# Patient Record
Sex: Male | Born: 1965 | Race: White | Hispanic: No | Marital: Married | Smoking: Former smoker
Health system: Southern US, Community
[De-identification: ages and names within clinical notes are randomized; demographics above are authoritative.]

## PROBLEM LIST (undated history)

## (undated) DIAGNOSIS — M8430XA Stress fracture, unspecified site, initial encounter for fracture: Secondary | ICD-10-CM

## (undated) DIAGNOSIS — F32A Depression, unspecified: Secondary | ICD-10-CM

## (undated) DIAGNOSIS — K219 Gastro-esophageal reflux disease without esophagitis: Secondary | ICD-10-CM

## (undated) DIAGNOSIS — L659 Nonscarring hair loss, unspecified: Secondary | ICD-10-CM

## (undated) DIAGNOSIS — F329 Major depressive disorder, single episode, unspecified: Secondary | ICD-10-CM

## (undated) DIAGNOSIS — K635 Polyp of colon: Secondary | ICD-10-CM

## (undated) DIAGNOSIS — F1010 Alcohol abuse, uncomplicated: Secondary | ICD-10-CM

## (undated) DIAGNOSIS — C439 Malignant melanoma of skin, unspecified: Secondary | ICD-10-CM

## (undated) HISTORY — DX: Depression, unspecified: F32.A

## (undated) HISTORY — DX: Major depressive disorder, single episode, unspecified: F32.9

## (undated) HISTORY — DX: Polyp of colon: K63.5

## (undated) HISTORY — PX: TONSILLECTOMY AND ADENOIDECTOMY: SUR1326

## (undated) HISTORY — DX: Stress fracture, unspecified site, initial encounter for fracture: M84.30XA

## (undated) HISTORY — PX: MELANOMA EXCISION: SHX5266

## (undated) HISTORY — DX: Alcohol abuse, uncomplicated: F10.10

## (undated) HISTORY — DX: Nonscarring hair loss, unspecified: L65.9

## (undated) HISTORY — DX: Gastro-esophageal reflux disease without esophagitis: K21.9

## (undated) HISTORY — DX: Malignant melanoma of skin, unspecified: C43.9

---

## 2001-05-12 ENCOUNTER — Encounter (INDEPENDENT_AMBULATORY_CARE_PROVIDER_SITE_OTHER): Payer: Self-pay | Admitting: Specialist

## 2001-05-12 ENCOUNTER — Ambulatory Visit (HOSPITAL_BASED_OUTPATIENT_CLINIC_OR_DEPARTMENT_OTHER): Admission: RE | Admit: 2001-05-12 | Discharge: 2001-05-12 | Payer: Self-pay | Admitting: General Surgery

## 2003-01-03 ENCOUNTER — Ambulatory Visit (HOSPITAL_COMMUNITY): Admission: RE | Admit: 2003-01-03 | Discharge: 2003-01-03 | Payer: Self-pay | Admitting: *Deleted

## 2003-01-03 ENCOUNTER — Encounter (INDEPENDENT_AMBULATORY_CARE_PROVIDER_SITE_OTHER): Payer: Self-pay | Admitting: *Deleted

## 2008-01-06 ENCOUNTER — Encounter: Payer: Self-pay | Admitting: Family Medicine

## 2008-03-15 ENCOUNTER — Encounter: Payer: Self-pay | Admitting: Family Medicine

## 2008-05-14 ENCOUNTER — Encounter: Payer: Self-pay | Admitting: Family Medicine

## 2008-09-12 ENCOUNTER — Encounter: Payer: Self-pay | Admitting: Family Medicine

## 2008-12-13 ENCOUNTER — Encounter: Payer: Self-pay | Admitting: Family Medicine

## 2009-03-13 ENCOUNTER — Encounter: Payer: Self-pay | Admitting: Family Medicine

## 2009-03-28 ENCOUNTER — Encounter: Payer: Self-pay | Admitting: Family Medicine

## 2009-04-10 ENCOUNTER — Encounter: Payer: Self-pay | Admitting: Family Medicine

## 2009-04-22 ENCOUNTER — Encounter: Payer: Self-pay | Admitting: Family Medicine

## 2009-04-25 ENCOUNTER — Encounter: Payer: Self-pay | Admitting: Family Medicine

## 2009-12-13 ENCOUNTER — Encounter: Payer: Self-pay | Admitting: Family Medicine

## 2009-12-25 ENCOUNTER — Encounter: Payer: Self-pay | Admitting: Family Medicine

## 2010-03-24 ENCOUNTER — Ambulatory Visit: Payer: Self-pay | Admitting: Family Medicine

## 2010-03-24 DIAGNOSIS — J302 Other seasonal allergic rhinitis: Secondary | ICD-10-CM | POA: Insufficient documentation

## 2010-03-24 DIAGNOSIS — K219 Gastro-esophageal reflux disease without esophagitis: Secondary | ICD-10-CM | POA: Insufficient documentation

## 2010-03-24 DIAGNOSIS — J309 Allergic rhinitis, unspecified: Secondary | ICD-10-CM | POA: Insufficient documentation

## 2010-03-24 DIAGNOSIS — F329 Major depressive disorder, single episode, unspecified: Secondary | ICD-10-CM | POA: Insufficient documentation

## 2010-03-24 DIAGNOSIS — Z860100 Personal history of colon polyps, unspecified: Secondary | ICD-10-CM | POA: Insufficient documentation

## 2010-03-24 DIAGNOSIS — Z85828 Personal history of other malignant neoplasm of skin: Secondary | ICD-10-CM | POA: Insufficient documentation

## 2010-06-10 NOTE — Procedures (Signed)
Summary: EGD Report/[facility]  EGD Report/[facility]   Imported By: Maryln Gottron 04/07/2010 15:13:22  _____________________________________________________________________  External Attachment:    Type:   Image     Comment:   External Document

## 2010-06-10 NOTE — Letter (Signed)
Summary: Henrine Screws MD  Henrine Screws MD   Imported By: Lester Ada 04/17/2010 11:24:16  _____________________________________________________________________  External Attachment:    Type:   Image     Comment:   External Document

## 2010-06-10 NOTE — Assessment & Plan Note (Signed)
Summary: NEW PATIENT/TRANSFER FROM [facility]   Vital Signs:  Patient profile:   45 year old male Height:      71 inches Weight:      225.50 pounds BMI:     31.56 Temp:     98.3 degrees F oral Pulse rate:   76 / minute Pulse rhythm:   regular BP sitting:   112 / 70  (left arm) Cuff size:   large  Vitals Entered By: Delilah Shan CMA Tove Wideman Dull) (March 24, 2010 9:56 AM) CC: New Patient from [place]   History of Present Illness: Here to establish care.    H/o elevation in Cr with renal eval.  Eval was wnl and there was thought not to be renal disease.    H/o depression, worse in winter.  Continues to exercise.  compliant with meds.  Had seen Dr. Ledon Snare.    FH colon CA with screening prev done.  No BRBPR. Requesting records,  GERD controlled.  Improved with weight loss.   Preventive Screening-Counseling & Management  Alcohol-Tobacco     Smoking Status: quit  Caffeine-Diet-Exercise     Does Patient Exercise: yes      Drug Use:  no.    Allergies (verified): No Known Drug Allergies  Past History:  Past Medical History: GERD (ICD-530.81) DEPRESSION (ICD-311) COLONIC POLYPS, HX OF (ICD-V12.72) SKIN CANCER, HX OF (ICD-V10.83), melanoma on L leg s/p resection ALLERGIC RHINITIS (ICD-477.9) Hair loss with finasteride per Dr. Danella Deis.    Past Surgical History:  ~1978  Tonsils / Adenoids  ~ 2003 Stage I Melanoma on leg, removed (Dr. Danella Deis)  Family History: Reviewed history and no changes required. Family History of Arthritis, grandparents Family History of Colon CA 1st degree relative <60, parents Family History Diabetes 1st degree relative, grandparents Family History Hypertension, parents, other blood relative Family History of Stroke F 1st degree relative <60, parents Family History of Heart Disease, grandparents, other blood relative F alive, h/o colon CA, 50's.  M alive, HTN, TIA  Social History: Reviewed history and no changes required. Occupation: Programmer, multimedia / Fish farm manager:  BS at [employer] with additional study at [employer] Married Former Smoker, quit 1994 Alcohol use-no Drug use-no Regular exercise-yesOccupation:  employed Smoking Status:  quit Drug Use:  no Does Patient Exercise:  yes  Review of Systems       See HPI.  Otherwise negative.    Physical Exam  General:  GEN: nad, alert and oriented HEENT: mucous membranes moist NECK: supple w/o LA CV: rrr.  no murmur PULM: ctab, no inc wob ABD: soft, +bs EXT: no edema SKIN: no acute rash    Impression & Recommendations:  Problem # 1:  DEPRESSION (ICD-311) No change in meds.  controlled.  d/w patient JX:BJYNWG and exercise during winter.  follow up as needed.  His updated medication list for this problem includes:    Budeprion Xl 300 Mg Xr24h-tab (Bupropion hcl) .Marland Loeper... Take 1 tablet by mouth once a day with 150 mg. tab    Bupropion Hcl 150 Mg Xr24h-tab (Bupropion hcl) .Marland Virani... Take 1 tablet by mouth once a day with 300 mg. tab.  Problem # 2:  FAMILY HISTORY OF COLON CA 1ST DEGREE RELATIVE <60 (ICD-V16.0) Prev colonoscopy done and requesting records .  Problem # 3:  GERD (ICD-530.81) controlled.  His updated medication list for this problem includes:    Omeprazole 20 Mg Cpdr (Omeprazole) .Marland Nembhard... Take 1 tablet by mouth once a day  Complete Medication List: 1)  Budeprion Xl 300 Mg Xr24h-tab (Bupropion hcl) .... Take 1 tablet by mouth once a day with 150 mg. tab 2)  Bupropion Hcl 150 Mg Xr24h-tab (Bupropion hcl) .... Take 1 tablet by mouth once a day with 300 mg. tab. 3)  Finasteride 5 Mg Tabs (Finasteride) .... Take 1/4 tablet by mouth once daily 4)  Omeprazole 20 Mg Cpdr (Omeprazole) .... Take 1 tablet by mouth once a day 5)  Fish Oil Oil (Fish oil) .... 1,000 mg. once daily  Other Orders: Admin 1st Vaccine ([address]) Flu Vaccine 52yrs + (60454)  Patient Instructions: 1)  We'll get your records.  Take care.  Let me know if you have other  concerns. Don't change your meds.    Orders Added: 1)  New Patient Level III [99203] 2)  Admin 1st Vaccine [90471] 3)  Flu Vaccine 75yrs + [09811]    Current Allergies (reviewed today): No known allergies    Preventive Care Screening  Colonoscopy:    Date:  05/11/2008    Results:  Done   Flu Vaccine Consent Questions     Do you have a history of severe allergic reactions to this vaccine? no    Any prior history of allergic reactions to egg and/or gelatin? no    Do you have a sensitivity to the preservative Thimersol? no    Do you have a past history of Guillan-Barre Syndrome? no    Do you currently have an acute febrile illness? no    Have you ever had a severe reaction to latex? no    Vaccine information given and explained to patient? yes    Are you currently pregnant? no    Lot Number:[id number]   Exp Date:11/08/2010   Site Given  Left Deltoid IM  Lugene Fuquay CMA (AAMA)  March 24, 2010 10:43 AM   .lbflu   Appended Document: NEW PATIENT/TRANSFER FROM [facility]    Clinical Lists Changes  Observations: Added new observation of PAST MED HX: GERD (ICD-530.81) DEPRESSION (ICD-311) COLONIC POLYPS, HX OF (ICD-V12.72)- follow up colonscopy due 04/2014 SKIN CANCER, HX OF (ICD-V10.83), melanoma on L leg s/p resection ALLERGIC RHINITIS (ICD-477.9) Hair loss with finasteride per Dr. Danella Deis.   (03/31/2010 0:41)       Past History:  Past Medical History: GERD (ICD-530.81) DEPRESSION (ICD-311) COLONIC POLYPS, HX OF (ICD-V12.72)- follow up colonscopy due 04/2014 SKIN CANCER, HX OF (ICD-V10.83), melanoma on L leg s/p resection ALLERGIC RHINITIS (ICD-477.9) Hair loss with finasteride per Dr. Danella Deis.

## 2010-06-10 NOTE — Letter (Signed)
Summary: [facility]   Imported By: Maryln Gottron 04/07/2010 15:08:41  _____________________________________________________________________  External Attachment:    Type:   Image     Comment:   External Document

## 2010-06-10 NOTE — Consult Note (Signed)
Summary: [facility]  [facility]   Imported By: Maryln Gottron 04/07/2010 15:06:30  _____________________________________________________________________  External Attachment:    Type:   Image     Comment:   External Document

## 2010-06-10 NOTE — Letter (Signed)
Summary: Donato Schultz MD/[facility]   Donato Schultz MD/[facility]   Imported By: Lester [place] 04/17/2010 11:27:14  _____________________________________________________________________  External Attachment:    Type:   Image     Comment:   External Document

## 2010-06-10 NOTE — Letter (Signed)
Summary: Joaquim Nam MD  Joaquim Nam MD   Imported By: Lester [place] 04/17/2010 11:25:48  _____________________________________________________________________  External Attachment:    Type:   Image     Comment:   External Document

## 2010-06-10 NOTE — Therapy (Signed)
Summary: Individual Hearing Test  Individual Hearing Test   Imported By: Lester [place] 04/17/2010 11:14:37  _____________________________________________________________________  External Attachment:    Type:   Image     Comment:   External Document

## 2010-06-13 NOTE — Letter (Signed)
Summary: Thomas Sloan at [facility]   Imported By: Lester  04/17/2010 11:28:15  _____________________________________________________________________  External Attachment:    Type:   Image     Comment:   External Document

## 2010-09-26 NOTE — Op Note (Signed)
[place]  Patient:    Thomas Sloan, Thomas Sloan Visit Number: [id number] MRN: [id number]          Service Type: DSU Location: [facility] Attending Physician:  Janalyn Rouse Dictated by:   Rose Phi. Maple Hudson, M.D. Proc. Date: 05/12/01 Admit Date:  05/12/2001   CC:         Hope M. Danella Deis, M.D.  Carolyne Fiscal, M.D.   Operative Report  PREOPERATIVE DIAGNOSIS:  Melanoma of the left anterior thigh and atypical nevi of the left lower quadrant, the left anterior chest and the right hip.  POSTOPERATIVE DIAGNOSIS:  Melanoma of the left anterior thigh and atypical nevi of the left lower quadrant, the left anterior chest and the right hip.  OPERATION PERFORMED:  Excision of same.  SURGEON:  Rose Phi. Maple Hudson, M.D.  ANESTHESIA:  General.  DESCRIPTION OF PROCEDURE:  After suitable anesthesia was induced, the patient was placed in the supine position.  Each of the areas on the anterior thigh, left lower quadrant, left anterior chest and right hip were prepped and draped out.  Infiltration of 0.25% Marcaine was also used.  We did the first lesion at the left anterior thigh which was the one that was melanoma.  We did a wide excision and then a layered closure using 3-0 Vicryl and interrupted 4-0 nylon closing a defect that measured 8 x 4 cm.  I then excised the one on the left lower quadrant which created a defect of 4.5 x 2 cm and had a simple closure of 4-0 nylon.  Lesion #3 was from the left anterior chest also with a simple closure of a 4 x 2 defect and then #4 was on the right anterior hip was was simple closure of a 2 x 1 cm defect.  Dressings were applied and the patient was transferred to recovery room in satisfactory condition having tolerated the procedure well. Dictated by:   Rose Phi. Maple Hudson, M.D. Attending Physician:  Janalyn Rouse DD:  05/12/01 TD:  05/12/01 Job: [id number]

## 2010-10-24 ENCOUNTER — Encounter: Payer: Self-pay | Admitting: Family Medicine

## 2010-10-27 ENCOUNTER — Ambulatory Visit (INDEPENDENT_AMBULATORY_CARE_PROVIDER_SITE_OTHER): Payer: BC Managed Care – PPO | Admitting: Family Medicine

## 2010-10-27 ENCOUNTER — Encounter: Payer: Self-pay | Admitting: Family Medicine

## 2010-10-27 VITALS — BP 110/70 | HR 55 | Temp 97.5°F | Ht 71.0 in | Wt 239.0 lb

## 2010-10-27 DIAGNOSIS — F3289 Other specified depressive episodes: Secondary | ICD-10-CM

## 2010-10-27 DIAGNOSIS — F329 Major depressive disorder, single episode, unspecified: Secondary | ICD-10-CM

## 2010-10-27 DIAGNOSIS — Z0000 Encounter for general adult medical examination without abnormal findings: Secondary | ICD-10-CM | POA: Insufficient documentation

## 2010-10-27 DIAGNOSIS — B351 Tinea unguium: Secondary | ICD-10-CM | POA: Insufficient documentation

## 2010-10-27 DIAGNOSIS — Z3009 Encounter for other general counseling and advice on contraception: Secondary | ICD-10-CM

## 2010-10-27 MED ORDER — BUPROPION HCL ER (XL) 150 MG PO TB24: ORAL_TABLET | ORAL | Status: DC

## 2010-10-27 NOTE — Assessment & Plan Note (Signed)
Minimal changes, I would observe and trim back as needed.  He's had recurrence x2.  I would be hesitant to use more oral lamisil and I talked with him about this.  No treatment now.

## 2010-10-27 NOTE — Progress Notes (Signed)
Nail fungus.  We talked about options. L 2nd and 3rd nail.  He's used lamisil before x2, now with return of symptoms.   Depression. Exercising and this helps.  There is a seasonal component.  Now down to 300mg  on wellbutrin.  Sleeping well.  No SI/HI. Not irritable. Needs refill.   Wants a vasectomy.  Wants referral.   Meds, vitals, and allergies reviewed.   ROS: See HPI.  Otherwise, noncontributory.  Nad, affect wnl ncat rrr ctab L 2nd and 3rd toenail with fungal appearing changes.

## 2010-10-27 NOTE — Assessment & Plan Note (Signed)
Refer to uro

## 2010-10-27 NOTE — Patient Instructions (Addendum)
Schedule a follow up appointment in November.  Take care.  Let me know if you have concerns in the meantime.  Glad to see you.   See Shirlee Limerick about your referral before your leave today.

## 2010-10-27 NOTE — Assessment & Plan Note (Signed)
Stable, continue exercise and wellbutrin.  No SI/HI.  In counseling.  Okay for outpatient fu. We'll meet again later in the year before winter.  He agrees.

## 2010-12-10 ENCOUNTER — Encounter: Payer: Self-pay | Admitting: Family Medicine

## 2010-12-23 ENCOUNTER — Other Ambulatory Visit: Payer: Self-pay | Admitting: *Deleted

## 2010-12-23 MED ORDER — OMEPRAZOLE 20 MG PO CPDR: 20.0000 mg | DELAYED_RELEASE_CAPSULE | Freq: Every day | ORAL | Status: DC

## 2011-02-18 ENCOUNTER — Telehealth: Payer: Self-pay | Admitting: *Deleted

## 2011-02-18 NOTE — Telephone Encounter (Signed)
Patient advised.

## 2011-02-18 NOTE — Telephone Encounter (Signed)
Patient says that he is getting conflicting messages because a friend of his had recently had this to happen and he was advised to get the injections because they said if he even had a paper cut on his hand and rubbed his dog and the raccoon's saliva was on the fur, he could contract the illness.

## 2011-02-18 NOTE — Telephone Encounter (Signed)
I have d/w ID and I see no reason for the patient to be at risk.  If he has further concerns, he'll need to talk to animal control about the vaccination, as we don't have it in the office.

## 2011-02-18 NOTE — Telephone Encounter (Signed)
Pt states that his dog got into a scuffle with a raccoon.  The dog is up to date with shots and was not bit.  Pt wasn't bit, but he did rub his dog's fur with bare hands looking for bites on his dog.  He is concerned that he could contract rabies from the racoon's saliva on his dogs fur.  He doesn't think he has any open sores on his hands, but he's not sure.  I checked with Drs. Copland and Dayton Martes.  They advised that pt call animal control, which he has done, and the CDC.  Pt wants one of the doctors here to consult with infectious disease department at [facility].  I advised pt that I would send a note to you for your advise, pt is asking that you call him, he is very concerned.

## 2011-02-18 NOTE — Telephone Encounter (Signed)
Please call pt. I called Dr. Drue Second with ID.  Given that dog was up to date on shots, dog wasn't bitten, pt wasn't bitten, then nothing else needs to be done other than wash his hands.

## 2011-02-20 ENCOUNTER — Emergency Department (HOSPITAL_COMMUNITY)
Admission: EM | Admit: 2011-02-20 | Discharge: 2011-02-20 | Disposition: A | Discharge status: Home / Self Care | Payer: BC Managed Care – PPO | Attending: Emergency Medicine | Admitting: Emergency Medicine

## 2011-02-20 DIAGNOSIS — Z85820 Personal history of malignant melanoma of skin: Secondary | ICD-10-CM | POA: Insufficient documentation

## 2011-02-20 DIAGNOSIS — Z203 Contact with and (suspected) exposure to rabies: Secondary | ICD-10-CM | POA: Insufficient documentation

## 2011-02-20 DIAGNOSIS — I10 Essential (primary) hypertension: Secondary | ICD-10-CM | POA: Insufficient documentation

## 2011-02-20 DIAGNOSIS — Z79899 Other long term (current) drug therapy: Secondary | ICD-10-CM | POA: Insufficient documentation

## 2011-02-23 ENCOUNTER — Inpatient Hospital Stay (INDEPENDENT_AMBULATORY_CARE_PROVIDER_SITE_OTHER)
Admission: RE | Admit: 2011-02-23 | Discharge: 2011-02-23 | Disposition: A | Discharge status: Home / Self Care | Payer: BC Managed Care – PPO | Source: Ambulatory Visit | Attending: Family Medicine | Admitting: Family Medicine

## 2011-02-23 DIAGNOSIS — Z23 Encounter for immunization: Secondary | ICD-10-CM

## 2011-02-27 ENCOUNTER — Inpatient Hospital Stay (INDEPENDENT_AMBULATORY_CARE_PROVIDER_SITE_OTHER)
Admission: RE | Admit: 2011-02-27 | Discharge: 2011-02-27 | Disposition: A | Discharge status: Home / Self Care | Payer: BC Managed Care – PPO | Source: Ambulatory Visit | Attending: Emergency Medicine | Admitting: Emergency Medicine

## 2011-02-27 DIAGNOSIS — Z23 Encounter for immunization: Secondary | ICD-10-CM

## 2011-03-04 ENCOUNTER — Ambulatory Visit (INDEPENDENT_AMBULATORY_CARE_PROVIDER_SITE_OTHER): Payer: BC Managed Care – PPO | Admitting: Family Medicine

## 2011-03-04 ENCOUNTER — Encounter: Payer: Self-pay | Admitting: Family Medicine

## 2011-03-04 VITALS — BP 104/80 | HR 65 | Temp 98.3°F | Wt 243.1 lb

## 2011-03-04 DIAGNOSIS — Z23 Encounter for immunization: Secondary | ICD-10-CM

## 2011-03-04 DIAGNOSIS — K219 Gastro-esophageal reflux disease without esophagitis: Secondary | ICD-10-CM

## 2011-03-04 DIAGNOSIS — F329 Major depressive disorder, single episode, unspecified: Secondary | ICD-10-CM

## 2011-03-04 DIAGNOSIS — F3289 Other specified depressive episodes: Secondary | ICD-10-CM

## 2011-03-04 MED ORDER — BUPROPION HCL ER (XL) 150 MG PO TB24: ORAL_TABLET | ORAL | Status: DC

## 2011-03-04 MED ORDER — OMEPRAZOLE MAGNESIUM 20 MG PO TBEC: 20.0000 mg | DELAYED_RELEASE_TABLET | Freq: Every day | ORAL | Status: DC

## 2011-03-04 NOTE — Assessment & Plan Note (Addendum)
Continue current meds.  D/w pt about exercise, seasonal sx, and continue current meds.  Call back with update.  He is sober and appears to be compensating well for depression.  He'll continue counseling.  >25 min spent with face to face with patient, >50% counseling.

## 2011-03-04 NOTE — Progress Notes (Signed)
Depressed Mood: he tried to dec dose, but sx increased.  Back to 450mg  a day.   Seeing Dayton Scrape, psychologist.   Sleep decreased/ Insomnia/Early awakening: sleeping well Interest decreased in activities: no Guilt or worthlessness: not worthless, but occ feels "not good enough."   Energy decreased: no Concentration difficulties: no Appetite disturbance or weight loss: no Psychomotor retardation/agitation: no Suicidal thoughts: no Still with some anxiety.  He usually has more trouble with mood in winter and he is aware of this. Sober since 2006.  Still in meetings.   GERD, nocturnal sx, worse off PPI.  We talked about diet and weight.  He's improved on the medicine.  He had lost some weight prev but gained it back.    PMH and SH reviewed  Meds, vitals, and allergies reviewed.   ROS: See HPI.  Otherwise negative.    NAD Speech fluent Judgement intact Affect wnl NCAT RRR CTAB No tremor

## 2011-03-04 NOTE — Patient Instructions (Signed)
Don't change your meds, take care and let me know if you have other concerns.  I would try to gradually increase your amount of exercise.

## 2011-03-04 NOTE — Assessment & Plan Note (Signed)
Continue current meds, continue diet and exercise.

## 2011-03-06 ENCOUNTER — Inpatient Hospital Stay (INDEPENDENT_AMBULATORY_CARE_PROVIDER_SITE_OTHER)
Admission: RE | Admit: 2011-03-06 | Discharge: 2011-03-06 | Disposition: A | Discharge status: Home / Self Care | Payer: BC Managed Care – PPO | Source: Ambulatory Visit | Attending: Family Medicine | Admitting: Family Medicine

## 2011-03-06 DIAGNOSIS — Z23 Encounter for immunization: Secondary | ICD-10-CM

## 2011-03-25 ENCOUNTER — Ambulatory Visit: Payer: BC Managed Care – PPO | Admitting: Family Medicine

## 2012-02-28 ENCOUNTER — Ambulatory Visit: Payer: BC Managed Care – PPO

## 2012-02-28 ENCOUNTER — Ambulatory Visit (INDEPENDENT_AMBULATORY_CARE_PROVIDER_SITE_OTHER): Payer: BC Managed Care – PPO | Admitting: Emergency Medicine

## 2012-02-28 VITALS — BP 115/76 | HR 80 | Temp 98.2°F | Resp 16 | Ht 71.0 in | Wt 230.0 lb

## 2012-02-28 DIAGNOSIS — M79606 Pain in leg, unspecified: Secondary | ICD-10-CM

## 2012-02-28 DIAGNOSIS — M79609 Pain in unspecified limb: Secondary | ICD-10-CM

## 2012-02-28 MED ORDER — NAPROXEN SODIUM 550 MG PO TABS: 550.0000 mg | ORAL_TABLET | Freq: Two times a day (BID) | ORAL | Status: DC

## 2012-02-28 NOTE — Progress Notes (Signed)
[facility] [address], [place] [address] [phone]  Date:  02/28/2012   Name:  Thomas Sloan   DOB:  [birth year]   MRN:  [id number]  PCP:  Crawford Givens, MD    Chief Complaint: Leg Injury   History of Present Illness:  Thomas Sloan is a 46 y.o. very pleasant male patient who presents with the following:  Ran a half marathon this morning and developed pain in left lower leg.  Completed the race and now cannot bear weight on the left leg.  Says that he has two previous stress fractures of the leg and this is by far more painful then either of those injuries.  Denies other complaint.  Patient Active Problem List  Diagnosis  . DEPRESSION  . ALLERGIC RHINITIS  . GERD  . SKIN CANCER, HX OF  . COLONIC POLYPS, HX OF  . Vasectomy evaluation  . Onychomycosis    Past Medical History  Diagnosis Date  . GERD (gastroesophageal reflux disease)   . Depression   . Colon polyps   . Melanoma     hx of on right leg, s/p resection  . Allergic rhinitis   . Hair loss     with finasteride per Dr. Danella Deis  . Alcohol abuse     Sober since 2004.  Still in meetings.     Past Surgical History  Procedure Date  . Tonsillectomy and adenoidectomy     around 1978  . Melanoma excision around 2003    Stage 1 on leg, removed by Dr. Danella Deis    History  Substance Use Topics  . Smoking status: Former Games developer  . Smokeless tobacco: Not on file  . Alcohol Use: No     Sober since 2006.  Still in meetings.     Family History  Problem Relation Age of Onset  . Hypertension Mother   . Stroke Mother     TIA  . Cancer Father     history of colon cancer    No Known Allergies  Medication list has been reviewed and updated.  Current Outpatient Prescriptions on File Prior to Visit  Medication Sig Dispense Refill  . buPROPion (WELLBUTRIN XL) 150 MG 24 hr tablet Take 3 tablets by mouth once a day.  270 tablet  3  . finasteride (PROSCAR) 5 MG tablet Take 1/4 tablet by  mouth once daily       . Fish Oil OIL Take 1000 mg once a day       . omeprazole (PRILOSEC OTC) 20 MG tablet Take 1 tablet (20 mg total) by mouth daily.  90 tablet  3    Review of Systems:  As per HPI, otherwise negative.    Physical Examination: Filed Vitals:   02/28/12 1334  BP: 115/76  Pulse: 80  Temp: 98.2 F (36.8 C)  Resp: 16   Filed Vitals:   02/28/12 1334  Height: 5\' 11"  (1.803 m)  Weight: 230 lb (104.327 kg)   Body mass index is 32.08 kg/(m^2). Ideal Body Weight: Weight in (lb) to have BMI = 25: 178.9    GEN: WDWN, NAD, Non-toxic, Alert & Oriented x 3 HEENT: Atraumatic, Normocephalic.  Ears and Nose: No external deformity. EXTR: No clubbing/cyanosis/edema NEURO: Normal gait.  PSYCH: Normally interactive. Conversant. Not depressed or anxious appearing.  Calm demeanor.  LEG:  No focal tenderness in left lower leg.  No ecchymosis or deformity.  Unable to bear weight  Assessment and Plan: Lower leg pain  Crutches [facility] Ortho follow up  Anaprox DS  Carmelina Dane, MD  [facility] reading (PRIMARY) by  Dr. Dareen Piano.  Unusual appearing proximal tibial cortical reaction but no fracture.  I have reviewed and agree with documentation. Robert P. Merla Riches, M.D.

## 2012-03-12 ENCOUNTER — Other Ambulatory Visit: Payer: Self-pay | Admitting: Family Medicine

## 2012-03-14 ENCOUNTER — Other Ambulatory Visit: Payer: Self-pay

## 2012-03-14 NOTE — Telephone Encounter (Addendum)
Received refill request electronically from pharmacy. Last office visit 03/04/11.  Is it okay to refill medication?

## 2012-03-14 NOTE — Telephone Encounter (Signed)
Please excuse Note recently put in for refill request.

## 2012-03-15 NOTE — Telephone Encounter (Signed)
Left v/m for [pt to call back about scheduling CPX.

## 2012-03-15 NOTE — Telephone Encounter (Signed)
Sent, needs CPE.  Thanks.  

## 2012-04-17 ENCOUNTER — Ambulatory Visit (INDEPENDENT_AMBULATORY_CARE_PROVIDER_SITE_OTHER): Payer: BC Managed Care – PPO | Admitting: Family Medicine

## 2012-04-17 VITALS — BP 114/79 | HR 64 | Temp 97.9°F | Resp 18 | Ht 71.0 in | Wt 244.0 lb

## 2012-04-17 DIAGNOSIS — J9801 Acute bronchospasm: Secondary | ICD-10-CM

## 2012-04-17 DIAGNOSIS — J209 Acute bronchitis, unspecified: Secondary | ICD-10-CM

## 2012-04-17 DIAGNOSIS — R059 Cough, unspecified: Secondary | ICD-10-CM

## 2012-04-17 DIAGNOSIS — R05 Cough: Secondary | ICD-10-CM

## 2012-04-17 MED ORDER — MUCINEX DM MAXIMUM STRENGTH 60-1200 MG PO TB12: 1.0000 | ORAL_TABLET | Freq: Two times a day (BID) | ORAL | Status: DC

## 2012-04-17 MED ORDER — ALBUTEROL SULFATE (2.5 MG/3ML) 0.083% IN NEBU
2.5000 mg | INHALATION_SOLUTION | Freq: Once | RESPIRATORY_TRACT | Status: AC
  Administered 2012-04-17: 2.5 mg via RESPIRATORY_TRACT

## 2012-04-17 MED ORDER — AZITHROMYCIN 250 MG PO TABS: ORAL_TABLET | ORAL | Status: DC

## 2012-04-17 MED ORDER — BENZONATATE 100 MG PO CAPS: 100.0000 mg | ORAL_CAPSULE | Freq: Three times a day (TID) | ORAL | Status: DC | PRN

## 2012-04-17 NOTE — Progress Notes (Signed)
[address]   [place], [place]  [address]   [phone]  Subjective:    Patient ID: Thomas Sloan, male    DOB: 08-24-1965, 46 y.o.   MRN: [id number]  HPIThis 46 y.o. male presents for evaluation of cough.  Onset one week ago.  Unproductive cough.  Unable to sleep.  Cough is relentless. Using steam, Mucinex, Robitussin.  Down deep.  No fever/chills/sweats.  +achy; fatigue.  +sinus congestion.  ST initially; rhinorrhea initally; no nasal congestion; no PND.  +Cough; no sputum.  No SOB.  Wheezing with cough.  No history of asthma; former smoker years ago.  No sick contacts.  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     Review of Systems  Constitutional: Negative for fever, chills, diaphoresis and fatigue.  HENT: Negative for congestion, sore throat, rhinorrhea, trouble swallowing, voice change, postnasal drip and sinus pressure.   Respiratory: Positive for cough and wheezing. Negative for shortness of breath.     Past Medical History  Diagnosis Date  . GERD (gastroesophageal reflux disease)   . Depression   . Colon polyps   . Melanoma     hx of on right leg, s/p resection  . Allergic rhinitis   . Hair loss     with finasteride per Dr. Danella Deis  . Alcohol abuse     Sober since 2004.  Still in meetings.     Past Surgical History  Procedure Date  . Tonsillectomy and adenoidectomy     around 1978  . Melanoma excision around 2003    Stage 1 on leg, removed by Dr. Danella Deis    Prior to Admission medications   Medication Sig Start Date End Date Taking? Authorizing Provider  buPROPion (WELLBUTRIN XL) 150 MG 24 hr tablet TAKE 3 TABLETS BY MOUTH ONCE DAILY 03/12/12  Yes Joaquim Nam, MD  finasteride (PROSCAR) 5 MG tablet Take 1/4 tablet by mouth once daily    Yes Historical Provider, MD  Omeprazole 20 MG TBEC TAKE ONE TABLET BY MOUTH DAILY 03/12/12  Yes Joaquim Nam, MD  azithromycin (ZITHROMAX) 250 MG tablet Take 2 tabs PO x 1 dose, then 1 tab PO QD x 4 days 04/17/12   Ethelda Chick, MD  benzonatate (TESSALON) 100 MG capsule Take 1-2 capsules (100-200 mg total) by mouth 3 (three) times daily as needed for cough. 04/17/12   Ethelda Chick, MD  Dextromethorphan-Guaifenesin (MUCINEX DM MAXIMUM STRENGTH) 60-1200 MG TB12 Take 1 tablet by mouth every 12 (twelve) hours. 04/17/12   Ethelda Chick, MD  Fish Oil OIL Take 1000 mg once a day     Historical Provider, MD  naproxen sodium (ANAPROX DS) 550 MG tablet Take 1 tablet (550 mg total) by mouth 2 (two) times daily with a meal. 02/28/12 02/27/13  Phillips Odor, MD    No Known Allergies  History   Social History  . Marital Status: Single    Spouse Name: N/A    Number of Children: N/A  . Years of Education: N/A   Occupational History  . Research scientist (physical sciences)   Social History Main Topics  . Smoking status: Former Games developer  . Smokeless tobacco: Not on file  . Alcohol Use: No     Comment: Sober since 2006.  Still in meetings.   . Drug Use: No  . Sexually Active: Yes    Birth Control/ Protection: None   Other Topics Concern  . 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 Hypertension Mother   . Stroke Mother     TIA  . Cancer Father     history of colon cancer  . Diabetes Maternal Grandmother   . COPD Maternal Grandfather   . Arthritis Paternal Grandmother   . Heart disease Paternal Grandfather        Objective:   Physical Exam  Nursing note and vitals reviewed. Constitutional: He is oriented to person, place, and time. He appears well-developed and well-nourished. No distress.  HENT:  Head: Normocephalic and atraumatic.  Right Ear: External ear normal.  Nose: Nose normal.  Mouth/Throat: Oropharynx is clear and moist.  Eyes: Conjunctivae normal and EOM are normal. Pupils are equal, round,  and reactive to light.  Neck: Normal range of motion. Neck supple.  Cardiovascular: Normal rate, regular rhythm and normal heart sounds.   No murmur heard. Pulmonary/Chest: Effort normal. No respiratory distress. He has wheezes. He has no rales.       SCANT EXPIRATORY WHEEZING WITH FORCED INSPIRATION.  HARSH BARKY COUGH DURING VISIT.  Lymphadenopathy:    He has no cervical adenopathy.  Neurological: He is alert and oriented to person, place, and time.  Skin: He is not diaphoretic.  Psychiatric: He has a normal mood and affect. His behavior is normal. Judgment and thought content normal.   ALBUTEROL NEBULIZER ADMINISTERED IN OFFICE.     Assessment & Plan:   1. Acute bronchitis  azithromycin (ZITHROMAX) 250 MG tablet, albuterol (PROVENTIL) (2.5 MG/3ML) 0.083% nebulizer solution 2.5 mg  2. Cough    3. Bronchospasm       1. Acute bronchitis:  New.  Treat with Mucinex DM maximum strength bid; rx for Tessalon Perles tid also provided.  Supportive care with fluids, rest.  If no improvement in 72 hours, to start Zpack. 2.  Bronchospasm:  New.  Mild.  S/p Albuterol nebulizer in office.  Contributing to cough.  Meds ordered this encounter  Medications  . benzonatate (TESSALON) 100 MG capsule    Sig: Take 1-2 capsules (100-200 mg total) by mouth 3 (three) times daily as needed for cough.    Dispense:  40 capsule    Refill:  0  . azithromycin (ZITHROMAX) 250 MG tablet    Sig: Take 2 tabs PO x 1 dose, then 1 tab PO QD x 4 days    Dispense:  6 tablet    Refill:  0  . Dextromethorphan-Guaifenesin (MUCINEX DM MAXIMUM STRENGTH) 60-1200 MG TB12    Sig: Take 1 tablet by mouth every 12 (twelve) hours.    Dispense:  30 each    Refill:  0  . albuterol (PROVENTIL) (2.5 MG/3ML) 0.083% nebulizer solution 2.5 mg    Sig:

## 2012-04-17 NOTE — Patient Instructions (Addendum)
1. Acute bronchitis  azithromycin (ZITHROMAX) 250 MG tablet, albuterol (PROVENTIL) (2.5 MG/3ML) 0.083% nebulizer solution 2.5 mg  2. Cough    3. Bronchospasm       FILL ZPACK IF NO IMPROVEMENT IN 72 HOURS.

## 2012-06-08 ENCOUNTER — Encounter: Payer: Self-pay | Admitting: Family Medicine

## 2012-06-08 ENCOUNTER — Ambulatory Visit (INDEPENDENT_AMBULATORY_CARE_PROVIDER_SITE_OTHER): Payer: BC Managed Care – PPO | Admitting: Family Medicine

## 2012-06-08 VITALS — BP 118/80 | HR 65 | Temp 98.5°F | Wt 248.0 lb

## 2012-06-08 DIAGNOSIS — F3289 Other specified depressive episodes: Secondary | ICD-10-CM

## 2012-06-08 DIAGNOSIS — F329 Major depressive disorder, single episode, unspecified: Secondary | ICD-10-CM

## 2012-06-08 MED ORDER — OMEPRAZOLE 20 MG PO TBEC: 20.0000 mg | DELAYED_RELEASE_TABLET | Freq: Every day | ORAL | Status: DC

## 2012-06-08 MED ORDER — BUPROPION HCL ER (XL) 150 MG PO TB24: ORAL_TABLET | ORAL | Status: DC

## 2012-06-08 NOTE — Progress Notes (Signed)
He had a stress fracture, treated by ortho and is getting back into exercising.  He's working on yoga and biking. Work is going well.    He recently had f/u with derm w/o any alarming findings.    H/o colon polyps prev.  FH colon cancer (father).  He isn't due for f/u yet, would need to consider in 2015.    Depressed Mood: slightly worsened with dec in exercise but he is working to remedy this with current exercise Sleep decreased/ Insomnia/Early awakening: no Interest decreased in activities: no Guilt or worthlessness: no Energy decreased: no Concentration difficulties: no Appetite disturbance or weight loss: no Psychomotor retardation/agitation:no Suicidal thoughts: no In counseling (S. Hunt) Compliant with meds. Sober, still in meetings.    PMH and SH reviewed  Meds, vitals, and allergies reviewed.   ROS: See HPI.  Otherwise negative.    NAD Speech fluent Judgement intact Affect wnl NCAT RRR CTAB No tremor

## 2012-06-08 NOTE — Patient Instructions (Addendum)
Take care.  Glad to see you.  Call back as needed.  

## 2012-06-09 NOTE — Assessment & Plan Note (Signed)
Doing well, continue current meds.  He'll work on gradual return to exercise.  Was prev running half marathons.  Continue counseling.  Doing well at home and work.  Still in meetings for alcohol and this is helpful.  F/u prn.  >25 min spent with face to face with patient, >50% counseling and/or coordinating care

## 2013-01-15 ENCOUNTER — Other Ambulatory Visit: Payer: Self-pay | Admitting: Family Medicine

## 2013-01-16 NOTE — Telephone Encounter (Signed)
Electronic refill request.  Please advise. 

## 2013-01-17 NOTE — Telephone Encounter (Signed)
Sent!

## 2013-02-22 ENCOUNTER — Ambulatory Visit (INDEPENDENT_AMBULATORY_CARE_PROVIDER_SITE_OTHER): Payer: BC Managed Care – PPO | Admitting: Family Medicine

## 2013-02-22 ENCOUNTER — Encounter: Payer: Self-pay | Admitting: Family Medicine

## 2013-02-22 VITALS — BP 110/74 | HR 62 | Temp 98.2°F | Ht 71.0 in | Wt 250.5 lb

## 2013-02-22 DIAGNOSIS — K219 Gastro-esophageal reflux disease without esophagitis: Secondary | ICD-10-CM

## 2013-02-22 DIAGNOSIS — L659 Nonscarring hair loss, unspecified: Secondary | ICD-10-CM

## 2013-02-22 DIAGNOSIS — F3289 Other specified depressive episodes: Secondary | ICD-10-CM

## 2013-02-22 DIAGNOSIS — F329 Major depressive disorder, single episode, unspecified: Secondary | ICD-10-CM

## 2013-02-22 DIAGNOSIS — Z23 Encounter for immunization: Secondary | ICD-10-CM

## 2013-02-22 MED ORDER — FINASTERIDE 5 MG PO TABS: ORAL_TABLET | ORAL | Status: DC

## 2013-02-22 MED ORDER — BUPROPION HCL ER (XL) 150 MG PO TB24: ORAL_TABLET | ORAL | Status: DC

## 2013-02-22 MED ORDER — OMEPRAZOLE 20 MG PO TBEC: 20.0000 mg | DELAYED_RELEASE_TABLET | Freq: Every day | ORAL | Status: DC

## 2013-02-22 NOTE — Patient Instructions (Signed)
We'll be in touch.  See about counseling re: food.  See what you can do about your sleep cycle and work schedule.  Take care.  Glad to see you.

## 2013-02-24 DIAGNOSIS — L659 Nonscarring hair loss, unspecified: Secondary | ICD-10-CM | POA: Insufficient documentation

## 2013-02-24 NOTE — Assessment & Plan Note (Signed)
Controlled, continue as is.  

## 2013-02-24 NOTE — Progress Notes (Signed)
Mood f/u.  No SI/Hi. In counseling.  Less exercise recently.  This is helpful for patient when he is in a routine. Work is good but he works late, at home. Discussed borders with work time.  Sober 8+ years.  Still going to meetings. Compliant with meds.   Asking about weight loss combo meds with wellbutrin.  I told him I'd look into them. D/w pt about counseling re: his relationship with food. He'll work on that.    Tolerating finasteride w/o ADE.  Wants to continue.    GERD controlled with current meds. No ADE.   Meds, vitals, and allergies reviewed.   ROS: See HPI.  Otherwise, noncontributory.  GEN: nad, alert and oriented HEENT: mucous membranes moist NECK: supple w/o LA CV: rrr.  no murmur PULM: ctab, no inc wob ABD: soft, +bs EXT: no edema SKIN: no acute rash

## 2013-02-24 NOTE — Assessment & Plan Note (Addendum)
Continue current meds. I check on other med combinations. D/w pt about sleep, work, exercise, sobriety.  >25 min spent with face to face with patient, >50% counseling and/or coordinating care.

## 2013-02-27 ENCOUNTER — Encounter: Payer: Self-pay | Admitting: Family Medicine

## 2013-02-28 ENCOUNTER — Encounter: Payer: Self-pay | Admitting: Family Medicine

## 2013-02-28 LAB — GLUCOSE (CC13)
ALT: 26 U/L (ref 10–40)
AST: 26 U/L
Cholesterol: 201 mg/dL — AB (ref 0–200)
Creat: 1.28
Glucose: 94
HDL: 47 mg/dL (ref 35–70)
LDL Calculated: 127 mg/dL
Triglycerides: 134

## 2013-03-02 ENCOUNTER — Telehealth: Payer: Self-pay | Admitting: Family Medicine

## 2013-03-02 MED ORDER — NALTREXONE HCL 50 MG PO TABS: ORAL_TABLET | ORAL | Status: DC

## 2013-03-02 NOTE — Telephone Encounter (Signed)
Patient advised.

## 2013-03-02 NOTE — Telephone Encounter (Signed)
Please call pt.  He was asking about naltrexone/bupropion combination for weight loss.  He has tried diet/exercise, continues to work on both.  He is in counseling.  He is not on opiates.  He is already on bupropion.  It is possible to add on naltrexone to see if he has added benefit.   Avoid all opiates.   Would take 1/2 tab every other day for 4 doses, then 1/2 tab a day from that point on.  Have him update me with weight in about 3-4 weeks, sooner if needed.  If any intolerance to the medicine, then stop it and notify me.  Would continue bupropion at current dose.  Would plan on treatment for about 3 months total, then reassess his weight.  Would try to limit tx to 3 months.

## 2013-04-16 ENCOUNTER — Other Ambulatory Visit: Payer: Self-pay | Admitting: Family Medicine

## 2013-04-18 NOTE — Telephone Encounter (Signed)
Rout to PCP 

## 2013-04-19 NOTE — Telephone Encounter (Signed)
Sent!

## 2013-05-22 ENCOUNTER — Other Ambulatory Visit: Payer: Self-pay | Admitting: Family Medicine

## 2013-05-22 MED ORDER — NALTREXONE HCL 50 MG PO TABS: 25.0000 mg | ORAL_TABLET | Freq: Every day | ORAL | Status: DC

## 2013-06-19 ENCOUNTER — Encounter: Payer: Self-pay | Admitting: Family Medicine

## 2013-06-19 ENCOUNTER — Ambulatory Visit (INDEPENDENT_AMBULATORY_CARE_PROVIDER_SITE_OTHER): Payer: BC Managed Care – PPO | Admitting: Family Medicine

## 2013-06-19 VITALS — BP 110/74 | HR 74 | Temp 98.1°F | Wt 220.2 lb

## 2013-06-19 DIAGNOSIS — E663 Overweight: Secondary | ICD-10-CM

## 2013-06-19 MED ORDER — NALTREXONE HCL 50 MG PO TABS: 25.0000 mg | ORAL_TABLET | ORAL | Status: DC

## 2013-06-19 NOTE — Patient Instructions (Signed)
Cut back to every other day on the naltrexone and see how that goes.  Notify me in a few weeks, sooner if needed.

## 2013-06-19 NOTE — Progress Notes (Signed)
Pre-visit discussion using our clinic review tool. No additional management support is needed unless otherwise documented below in the visit note.  Weight loss noted.  Complaint with meds.  Now ~220 at home. Discussed meds.  No ADE.  He worked on diet and went to counseling. He has a fitbit. His appetite is coming back some. He is working with a Clinical research associate.  We talked about avoiding over compensating, ie over-training, under-eating.    His mood is good.    Meds, vitals, and allergies reviewed.   ROS: See HPI.  Otherwise, noncontributory.  Nad ncat Mmm rrr ctab abd soft, not ttp Ext w/o edema.

## 2013-06-20 DIAGNOSIS — E663 Overweight: Secondary | ICD-10-CM | POA: Insufficient documentation

## 2013-06-20 NOTE — Assessment & Plan Note (Signed)
Sig weight loss with diet/exericse/meds. Would taper to qod on naltrexone and then likely off med. Continue diet and exercise. He agrees.  Counseling has helped him, he'll continue.

## 2013-07-20 ENCOUNTER — Other Ambulatory Visit: Payer: BC Managed Care – PPO

## 2013-07-20 ENCOUNTER — Ambulatory Visit (INDEPENDENT_AMBULATORY_CARE_PROVIDER_SITE_OTHER): Payer: BC Managed Care – PPO | Admitting: Family Medicine

## 2013-07-20 ENCOUNTER — Encounter: Payer: Self-pay | Admitting: Family Medicine

## 2013-07-20 VITALS — BP 106/74 | HR 76 | Temp 97.9°F | Wt 219.2 lb

## 2013-07-20 DIAGNOSIS — R197 Diarrhea, unspecified: Secondary | ICD-10-CM

## 2013-07-20 NOTE — Patient Instructions (Signed)
Go to the lab on the way out.  We'll contact you with your lab report. Try imodium in the meantime.   Take care.

## 2013-07-20 NOTE — Progress Notes (Signed)
Pre visit review using our clinic review tool, if applicable. No additional management support is needed unless otherwise documented below in the visit note.  Diarrhea for 2-3 weeks. Appetite is still okay.  He doesn't feel poorly.  He has had episodic diarrhea prev, but usually not this long.  No fevers.  No vomiting. No blood in stool.  Very loose stools.  Inc in gas and stomach grumbling.  No clear triggers with food.  No travel. Started about the time he cut back on natrexone.  He had cut back on caffeine.  He hasn't tried imodium or similar.  Variable BM frequency, none to a few per day.  Baseline 2 BMs per day.  No travel prev.  Leaving to go to [facility] this weekend.  Some clear mucous with BMs.  No sig abd pain.  No sick contacts. No recent antibiotics.   He doesn't feel especially ill but it is annoying.    Meds, vitals, and allergies reviewed.   ROS: See HPI.  Otherwise, noncontributory.  GEN: nad, alert and oriented HEENT: mucous membranes moist NECK: supple w/o LA CV: rrr. PULM: ctab, no inc wob ABD: soft, +bs EXT: no edema SKIN: no acute rash

## 2013-07-21 ENCOUNTER — Other Ambulatory Visit (INDEPENDENT_AMBULATORY_CARE_PROVIDER_SITE_OTHER): Payer: BC Managed Care – PPO

## 2013-07-21 DIAGNOSIS — R197 Diarrhea, unspecified: Secondary | ICD-10-CM

## 2013-07-21 LAB — FECAL OCCULT BLOOD, IMMUNOCHEMICAL: Fecal Occult Bld: NEGATIVE

## 2013-07-21 LAB — CLOSTRIDIUM DIFFICILE EIA: CDIFTX: NEGATIVE

## 2013-07-21 NOTE — Assessment & Plan Note (Signed)
Unclear source, check basic stool studies and use imodium prn.  He agrees. Nontoxic.

## 2013-07-24 LAB — STOOL CULTURE

## 2013-12-11 ENCOUNTER — Encounter: Payer: Self-pay | Admitting: Family Medicine

## 2013-12-12 ENCOUNTER — Other Ambulatory Visit: Payer: Self-pay | Admitting: Family Medicine

## 2013-12-12 MED ORDER — NALTREXONE HCL 50 MG PO TABS: ORAL_TABLET | ORAL | Status: DC

## 2014-01-06 LAB — HEPATIC FUNCTION PANEL
ALT: 16 U/L (ref 10–40)
AST: 16 U/L (ref 14–40)

## 2014-01-06 LAB — LIPID PANEL
CHOLESTEROL: 142 mg/dL (ref 0–200)
HDL: 46 mg/dL (ref 35–70)
LDL Cholesterol: 78 mg/dL
TRIGLYCERIDES: 88 mg/dL (ref 40–160)

## 2014-01-06 LAB — BASIC METABOLIC PANEL
CREATININE: 1.3 mg/dL (ref <=1.3)
Glucose: 97 mg/dL

## 2014-01-24 IMAGING — CR DG TIBIA/FIBULA 2V*L*
3 series · 3 of 3 positions shown · non-contrast
Comparison: None.

CLINICAL DATA: Possible stress fracture.  Pain.

LEFT TIBIA AND FIBULA - 2 VIEW

[AP (1 of 2)]
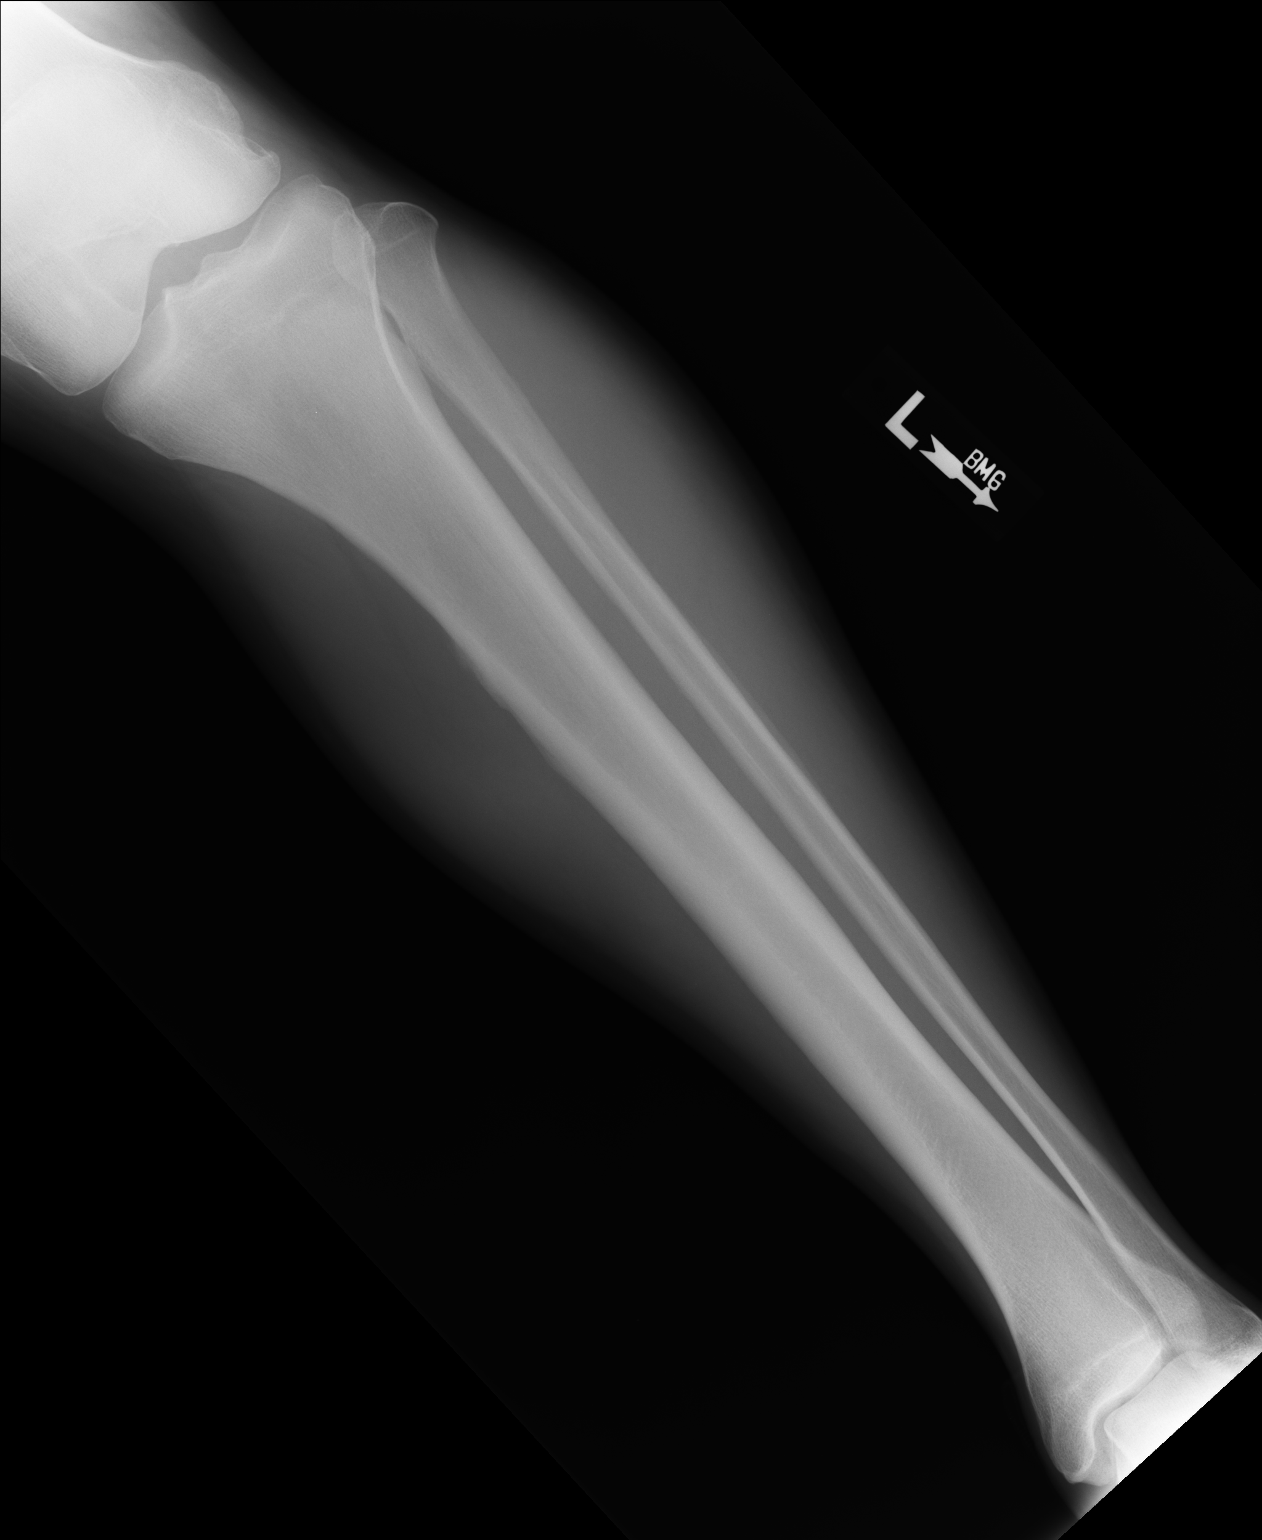

[lateral]
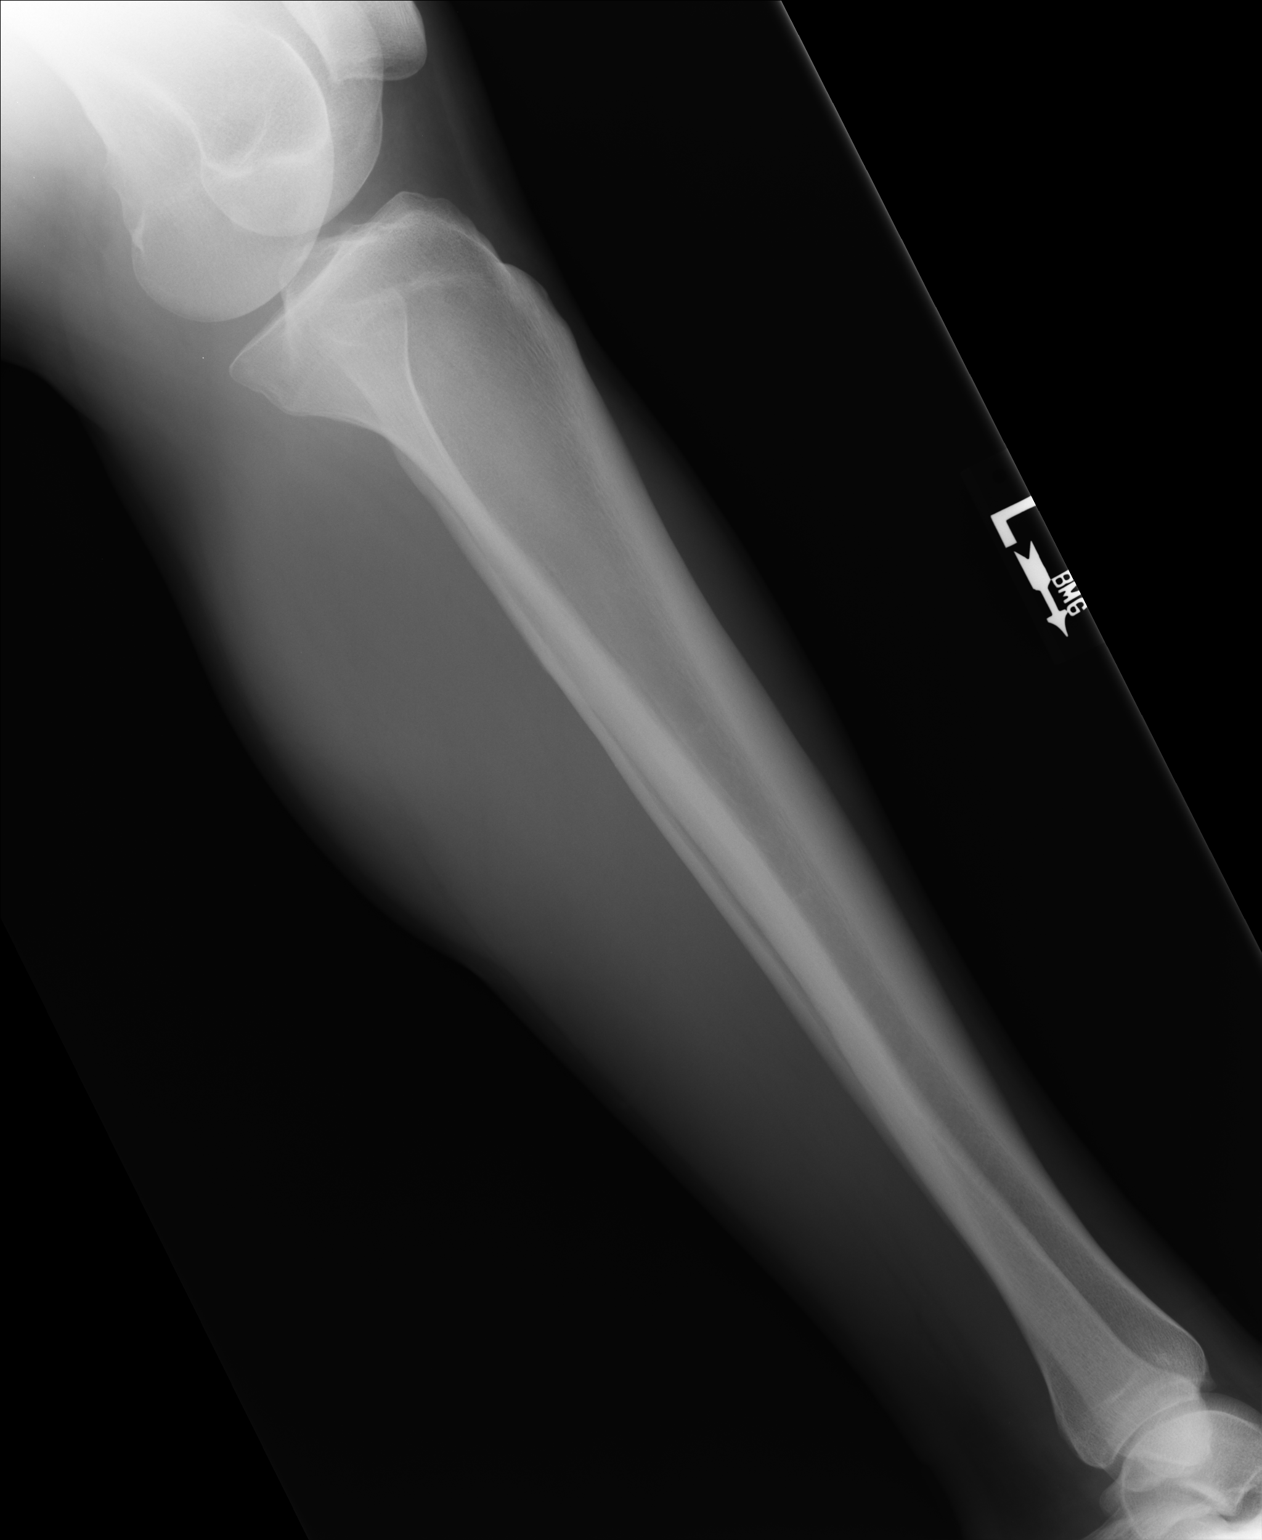

[AP (2 of 2)]
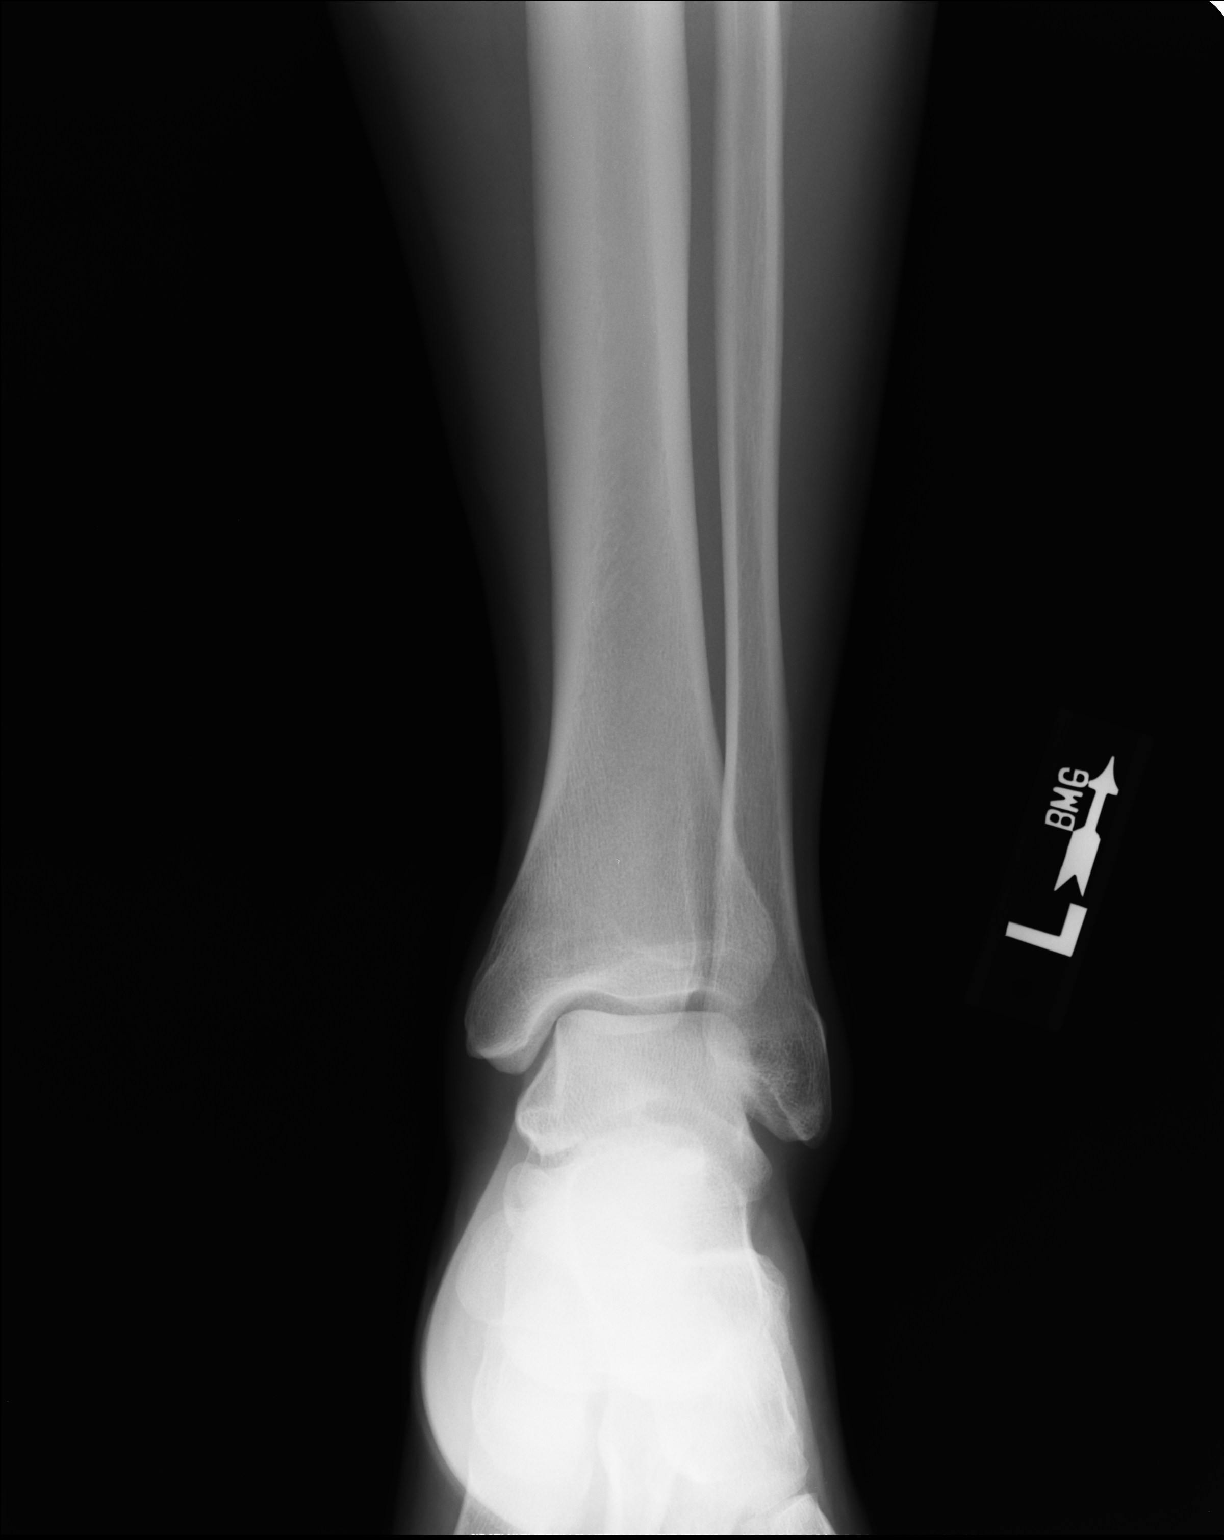

[3 of 3 positions shown; findings below may reference images not displayed]

FINDINGS: Imaged bones, joints and soft tissues appear normal.
IMPRESSION: Negative exam.

## 2014-02-12 ENCOUNTER — Ambulatory Visit: Payer: Self-pay | Admitting: Family Medicine

## 2014-02-15 ENCOUNTER — Ambulatory Visit (INDEPENDENT_AMBULATORY_CARE_PROVIDER_SITE_OTHER): Payer: BC Managed Care – PPO | Admitting: Family Medicine

## 2014-02-15 ENCOUNTER — Ambulatory Visit: Payer: BC Managed Care – PPO | Admitting: Family Medicine

## 2014-02-15 ENCOUNTER — Encounter: Payer: Self-pay | Admitting: Family Medicine

## 2014-02-15 VITALS — BP 118/78 | HR 74 | Temp 98.2°F | Wt 226.5 lb

## 2014-02-15 DIAGNOSIS — Z23 Encounter for immunization: Secondary | ICD-10-CM

## 2014-02-15 DIAGNOSIS — F3342 Major depressive disorder, recurrent, in full remission: Secondary | ICD-10-CM

## 2014-02-15 DIAGNOSIS — Z800 Family history of malignant neoplasm of digestive organs: Secondary | ICD-10-CM

## 2014-02-15 DIAGNOSIS — E663 Overweight: Secondary | ICD-10-CM

## 2014-02-15 MED ORDER — NALTREXONE HCL 50 MG PO TABS: 25.0000 mg | ORAL_TABLET | Freq: Every day | ORAL | Status: DC

## 2014-02-15 MED ORDER — BUPROPION HCL ER (XL) 150 MG PO TB24: ORAL_TABLET | ORAL | Status: DC

## 2014-02-15 MED ORDER — OMEPRAZOLE 20 MG PO TBEC: 20.0000 mg | DELAYED_RELEASE_TABLET | Freq: Every day | ORAL | Status: DC

## 2014-02-15 NOTE — Patient Instructions (Addendum)
Rosaria Ferries will call about your referral. Take care.  Glad to see you.  If you have significant weight changes then notify me.

## 2014-02-15 NOTE — Progress Notes (Signed)
Pre visit review using our clinic review tool, if applicable. No additional management support is needed unless otherwise documented below in the visit note.  FH colon cancer. Will refer for GI eval.  Prev colonoscopy done, h/o polyps noted.    Weight loss.  Med use d/w pt.  No ADE.  Has helped with cravings.  Noted less effect at night, will change his dosing time to mid morning to see if that helps.  Weight lower overall.  Wants to continue as is for now.  This seems reasonable.  Labs from work d/w pt.  Lipids and sugar improved.  Cr at baseline, has been worked up [employer] and this is likely a normal level for patient, given his muscle mass.   tdap done 12/23/11 at work per patient.  Updated records.    Meds, vitals, and allergies reviewed.   ROS: See HPI.  Otherwise, noncontributory.  GEN: nad, alert and oriented HEENT: mucous membranes moist NECK: supple w/o LA CV: rrr.  PULM: ctab, no inc wob ABD: soft, +bs EXT: no edema

## 2014-02-16 NOTE — Assessment & Plan Note (Signed)
Controlled, sober, doing well.  Still in meetings.  Doing well at home and work. Continue as is.

## 2014-02-16 NOTE — Assessment & Plan Note (Addendum)
Would continue meds as is, with continued diet and exercise.  If sig weight loss, then he'll notify me. He can vary the timing of the naltrexone in the meantime.  Labs d/w pt, lipids improved from prev, see scanned copies. >25 minutes spent in face to face time with patient, >50% spent in counselling or coordination of care.

## 2014-02-22 ENCOUNTER — Encounter: Payer: Self-pay | Admitting: Family Medicine

## 2014-04-18 LAB — HM COLONOSCOPY

## 2014-04-27 ENCOUNTER — Encounter: Payer: Self-pay | Admitting: Family Medicine

## 2014-05-01 ENCOUNTER — Encounter: Payer: Self-pay | Admitting: Family Medicine

## 2014-05-07 ENCOUNTER — Encounter: Payer: Self-pay | Admitting: Family Medicine

## 2015-01-16 LAB — BASIC METABOLIC PANEL
Creatinine: 1.2 mg/dL (ref <=1.3)
Glucose: 99 mg/dL

## 2015-01-16 LAB — LIPID PANEL
CHOLESTEROL: 178 mg/dL (ref 0–200)
HDL: 46 mg/dL (ref 35–70)
LDL Cholesterol: 102 mg/dL
Triglycerides: 148 mg/dL (ref 40–160)

## 2015-01-16 LAB — HEPATIC FUNCTION PANEL
ALT: 27 U/L (ref 10–40)
AST: 24 U/L (ref 14–40)

## 2015-02-07 ENCOUNTER — Encounter: Payer: Self-pay | Admitting: Family Medicine

## 2015-02-07 ENCOUNTER — Ambulatory Visit (INDEPENDENT_AMBULATORY_CARE_PROVIDER_SITE_OTHER): Payer: BC Managed Care – PPO | Admitting: Family Medicine

## 2015-02-07 VITALS — BP 112/70 | HR 70 | Temp 98.4°F | Wt 239.8 lb

## 2015-02-07 DIAGNOSIS — F3342 Major depressive disorder, recurrent, in full remission: Secondary | ICD-10-CM

## 2015-02-07 DIAGNOSIS — E663 Overweight: Secondary | ICD-10-CM | POA: Diagnosis not present

## 2015-02-07 DIAGNOSIS — Z23 Encounter for immunization: Secondary | ICD-10-CM | POA: Diagnosis not present

## 2015-02-07 MED ORDER — BUPROPION HCL ER (XL) 150 MG PO TB24: ORAL_TABLET | ORAL | Status: DC

## 2015-02-07 MED ORDER — OMEPRAZOLE 20 MG PO TBEC: 20.0000 mg | DELAYED_RELEASE_TABLET | Freq: Every day | ORAL | Status: DC

## 2015-02-07 MED ORDER — NALTREXONE HCL 50 MG PO TABS: 25.0000 mg | ORAL_TABLET | Freq: Every day | ORAL | Status: DC

## 2015-02-07 NOTE — Patient Instructions (Addendum)
Stop the naltrexone for 1 month then restart it.  Take care.  Glad to see you.  Keep working on diet and exercise.

## 2015-02-07 NOTE — Progress Notes (Signed)
Pre visit review using our clinic review tool, if applicable. No additional management support is needed unless otherwise documented below in the visit note.  Overweight.  His weight loss effect leveled off with naltrexone after initial success.  Still below initial/max weight.  We talked about taking a break from the medicine and then restarting.  We talked about diet and exercise.  His is working on all of that.  Labs d/w pt, from work site.    Mood is good. No ADE on med.  Still sober, in meetings.  Marriage is good, anniversary coming up. Sleeping well.  Doing well with work.    HIV screening neg per patient in ~1993.    Meds, vitals, and allergies reviewed.   ROS: See HPI.  Otherwise, noncontributory.  GEN: nad, alert and oriented HEENT: mucous membranes moist NECK: supple w/o LA CV: rrr. PULM: ctab, no inc wob ABD: soft, +bs EXT: no edema SKIN: no acute rash

## 2015-02-08 ENCOUNTER — Encounter: Payer: Self-pay | Admitting: Family Medicine

## 2015-02-08 NOTE — Assessment & Plan Note (Signed)
Can stop naltrexone for about 1 month, then restart to see if effect returns.  D/w pt about diet and exercise.  He agrees.  Labs d/w pt. See scanned labs. Labs okay.

## 2015-02-08 NOTE — Assessment & Plan Note (Signed)
Sober, mood is good, doing well.  No reason to change med at this point.  Continue as is.  He agrees.

## 2015-04-01 ENCOUNTER — Encounter: Payer: Self-pay | Admitting: Family Medicine

## 2015-04-03 MED ORDER — AMOXICILLIN-POT CLAVULANATE 875-125 MG PO TABS: 1.0000 | ORAL_TABLET | Freq: Two times a day (BID) | ORAL | Status: DC

## 2016-01-10 ENCOUNTER — Other Ambulatory Visit: Payer: Self-pay | Admitting: Family Medicine

## 2016-02-16 ENCOUNTER — Other Ambulatory Visit: Payer: Self-pay | Admitting: Family Medicine

## 2016-02-17 NOTE — Telephone Encounter (Signed)
Last refill and last OV 01/11/15 #270 + 3, pt has appt scheduled on 03/13/16. Ok to refill?

## 2016-02-18 NOTE — Telephone Encounter (Signed)
Sent. Thanks.   

## 2016-03-13 ENCOUNTER — Ambulatory Visit (INDEPENDENT_AMBULATORY_CARE_PROVIDER_SITE_OTHER): Payer: BC Managed Care – PPO | Admitting: Family Medicine

## 2016-03-13 ENCOUNTER — Encounter: Payer: Self-pay | Admitting: Family Medicine

## 2016-03-13 VITALS — BP 122/86 | HR 78 | Temp 98.0°F | Ht 71.0 in | Wt 242.5 lb

## 2016-03-13 DIAGNOSIS — Z0000 Encounter for general adult medical examination without abnormal findings: Secondary | ICD-10-CM | POA: Diagnosis not present

## 2016-03-13 DIAGNOSIS — Z7189 Other specified counseling: Secondary | ICD-10-CM | POA: Insufficient documentation

## 2016-03-13 DIAGNOSIS — L659 Nonscarring hair loss, unspecified: Secondary | ICD-10-CM

## 2016-03-13 DIAGNOSIS — F3342 Major depressive disorder, recurrent, in full remission: Secondary | ICD-10-CM

## 2016-03-13 DIAGNOSIS — E663 Overweight: Secondary | ICD-10-CM

## 2016-03-13 DIAGNOSIS — Z23 Encounter for immunization: Secondary | ICD-10-CM

## 2016-03-13 MED ORDER — OMEPRAZOLE 20 MG PO CPDR: DELAYED_RELEASE_CAPSULE | ORAL | 3 refills | Status: DC

## 2016-03-13 MED ORDER — BUPROPION HCL ER (XL) 150 MG PO TB24: 450.0000 mg | ORAL_TABLET | Freq: Every day | ORAL | 3 refills | Status: DC

## 2016-03-13 MED ORDER — FINASTERIDE 5 MG PO TABS: ORAL_TABLET | ORAL | 3 refills | Status: DC

## 2016-03-13 NOTE — Patient Instructions (Signed)
Take care.  Glad to see you. Update me as needed.  

## 2016-03-13 NOTE — Progress Notes (Signed)
Pre visit review using our clinic review tool, if applicable. No additional management support is needed unless otherwise documented below in the visit note. 

## 2016-03-13 NOTE — Assessment & Plan Note (Signed)
If incapacitated, would have wife designated.  

## 2016-03-13 NOTE — Progress Notes (Signed)
CPE- See plan.  Routine anticipatory guidance given to patient.  See health maintenance. Tetanus 2013 Flu 2017 PNA and shingles not due, d/w pt Colonoscopy up to date, 2015 PSA not due. D/w pt.  Living will d/w pt. If incapacitated, would have wife designated.  Diet and exercise d/w pt.  See below.    Weight d/w pt.  Back on diet and exercise.  Walking more.  He doesn't do as well with jogging, recently.  D/w pt about diet and monitoring his portions.  He likely has a reward drive with food/eating.  He is trying to avoid trigger foods.  He had talked with nutrition prev.  Mood is pretty good recently.  He tends to have more troubles in the shorter days of winter and he is trying to adjust for that.  No SI/HI.    Hair loss controlled.  No ADE on med.    PMH and SH reviewed  ROS: Per HPI.  Unless specifically indicated otherwise in HPI, the patient denies:  General: fever. Eyes: acute vision changes ENT: sore throat Cardiovascular: chest pain Respiratory: SOB GI: vomiting GU: dysuria Musculoskeletal: acute back pain Derm: acute rash Neuro: acute motor dysfunction Psych: worsening mood Endocrine: polydipsia Heme: bleeding Allergy: hayfever   Meds, vitals, and allergies reviewed.    GEN: nad, alert and oriented HEENT: mucous membranes moist NECK: supple w/o LA CV: rrr.  PULM: ctab, no inc wob ABD: soft, +bs EXT: no edema SKIN: no acute rash

## 2016-03-15 NOTE — Assessment & Plan Note (Signed)
Okay for outpatient follow-up. Reasonable control with current medications. He is working to maintain healthy habits. Continue as is. He agrees.

## 2016-03-15 NOTE — Assessment & Plan Note (Signed)
Tetanus 2013 Flu 2017 PNA and shingles not due, d/w pt Colonoscopy up to date, 2015 PSA not due. D/w pt.  Living will d/w pt. If incapacitated, would have wife designated.  Diet and exercise d/w pt

## 2016-03-15 NOTE — Assessment & Plan Note (Signed)
Discussed with patient about diet and exercise. He is working to control his diet. Discussed with him about exercise. Recent labs are unremarkable. See scanned forms. Labs discussed with patient.

## 2016-03-15 NOTE — Assessment & Plan Note (Signed)
Good effect. No adverse effect. Continue medication.

## 2016-03-17 ENCOUNTER — Encounter: Payer: Self-pay | Admitting: Family Medicine

## 2016-03-17 LAB — GLUCOSE, 1 HOUR
ALT: 22
AST: 22 U/L
CHOLESTEROL, TOTAL: 170
Creatinine, Ser: 1.24
GLUCOSE: 98
HDL: 40
Hemoglobin: 13.9
LDL CHOLESTEROL (CALC): 105
Platelets: 174
Triglycerides: 123

## 2016-03-23 ENCOUNTER — Encounter: Payer: Self-pay | Admitting: Family Medicine

## 2016-06-16 ENCOUNTER — Encounter: Payer: Self-pay | Admitting: Family Medicine

## 2016-06-22 ENCOUNTER — Other Ambulatory Visit: Payer: Self-pay | Admitting: Family Medicine

## 2016-06-22 MED ORDER — METOPROLOL TARTRATE 25 MG PO TABS: 12.5000 mg | ORAL_TABLET | Freq: Two times a day (BID) | ORAL | 1 refills | Status: DC | PRN

## 2016-07-03 ENCOUNTER — Encounter: Payer: Self-pay | Admitting: Family Medicine

## 2016-11-16 ENCOUNTER — Telehealth: Payer: Self-pay | Admitting: Family Medicine

## 2016-11-16 ENCOUNTER — Ambulatory Visit (INDEPENDENT_AMBULATORY_CARE_PROVIDER_SITE_OTHER): Payer: BC Managed Care – PPO | Admitting: Nurse Practitioner

## 2016-11-16 ENCOUNTER — Encounter: Payer: Self-pay | Admitting: Nurse Practitioner

## 2016-11-16 VITALS — BP 128/78 | HR 72 | Temp 98.4°F | Wt 248.0 lb

## 2016-11-16 DIAGNOSIS — T63481A Toxic effect of venom of other arthropod, accidental (unintentional), initial encounter: Secondary | ICD-10-CM

## 2016-11-16 MED ORDER — METHYLPREDNISOLONE ACETATE 80 MG/ML IJ SUSP
80.0000 mg | Freq: Once | INTRAMUSCULAR | Status: AC
  Administered 2016-11-16: 80 mg via INTRAMUSCULAR

## 2016-11-16 MED ORDER — RANITIDINE HCL 150 MG PO TABS: 150.0000 mg | ORAL_TABLET | Freq: Two times a day (BID) | ORAL | 0 refills | Status: DC

## 2016-11-16 MED ORDER — METHYLPREDNISOLONE ACETATE 80 MG/ML IJ SUSP: 80.0000 mg | Freq: Once | INTRAMUSCULAR | Status: DC

## 2016-11-16 MED ORDER — METHYLPREDNISOLONE 4 MG PO TBPK: ORAL_TABLET | ORAL | 0 refills | Status: DC

## 2016-11-16 MED ORDER — DIPHENHYDRAMINE HCL 25 MG PO TABS: 25.0000 mg | ORAL_TABLET | Freq: Three times a day (TID) | ORAL | 0 refills | Status: DC | PRN

## 2016-11-16 NOTE — Patient Instructions (Signed)
Start oral prednisone tomorrow.  Elevate leg as much as possible.  Bee, Wasp, or [employer], Adult Bees, wasps, and hornets are part of a family of insects that can sting people. These stings can cause pain and inflammation, but they are usually not serious. However, some people may have an allergic reaction to a sting. This can cause the symptoms to be more severe. What increases the risk? You may be at a greater risk of getting stung if you:  Provoke a stinging insect by swatting or disturbing it.  Wear strong-smelling soaps, deodorants, or body sprays.  Spend time outdoors near gardens with flowers or fruit trees or in clothes that expose skin.  Eat or drink outside.  What are the signs or symptoms? Common symptoms of this condition include:  A red lump in the skin that sometimes has a tiny hole in the center. In some cases, a stinger may be in the center of the wound.  Pain and itching at the sting site.  Redness and swelling around the sting site. If you have an allergic reaction (localized allergic reaction), the swelling and redness may spread out from the sting site. In some cases, this reaction can continue to develop over the next 24-48 hours.  In rare cases, a person may have a severe allergic reaction (anaphylactic reaction) to a sting. Symptoms of an anaphylactic reaction may include:  Wheezing or difficulty breathing.  Raised, itchy, red patches on the skin (hives).  Nausea or vomiting.  Abdominal cramping.  Diarrhea.  Tightness in the chest or chest pain.  Dizziness or fainting.  Redness of the face (flushing).  Hoarse voice.  Swollen tongue, lips, or face.  How is this diagnosed? This condition is usually diagnosed based on your symptoms and medical history as well as a physical exam. You may have an allergy test to determine if you are allergic to the substance that the insect injected during the sting (venom). How is this treated? If you were  stung by a bee, the stinger and a small sac of venom may be in the wound. It is important to remove the stinger as soon as possible. You can do this by brushing across the wound with gauze, a fingernail, or a flat card such as a credit card. Removing the stinger can help reduce the severity of your body's reaction to the sting. Most stings can be treated with:  Icing to reduce swelling in the area.  Medicines (antihistamines) to treat itching or an allergic reaction.  Medicines to help reduce pain. These may be medicines that you take by mouth, or medicated creams or lotions that you apply to your skin.  Pay close attention to your symptoms after you have been stung. If possible, have someone stay with you to make sure you do not have an allergic reaction. If you have any signs of an allergic reaction, call your health care provider. If you have ever had a severe allergic reaction, your health care provider may give you an inhaler or injectable medicine (epinephrine auto-injector) to use if necessary. Follow these instructions at home:  Wash the sting site 2-3 times each day with soap and water as told by your health care provider.  Apply or take over-the-counter and prescription medicines only as told by your health care provider.  If directed, apply ice to the sting area. ? Put ice in a plastic bag. ? Place a towel between your skin and the bag. ? Leave the ice on for  20 minutes, 2-3 times a day.  Do not scratch the sting area.  If you had a severe allergic reaction to a sting, you may need: ? To wear a medical bracelet or necklace that lists the allergy. ? To learn when and how to use an anaphylaxis kit or epinephrine injection. Your family members and coworkers may also need to learn this. ? To carry an anaphylaxis kit or epinephrine injection with you at all times. How is this prevented?  Avoid swatting at stinging insects and disturbing insect nests.  Do not use fragrant soaps or  lotions.  Wear shoes, pants, and long sleeves when spending time outdoors, especially in grassy areas where stinging insects are common.  Keep outdoor areas free from nests or hives.  Keep food and drink containers covered when eating outdoors.  Avoid working or sitting near [employer], if possible.  Wear gloves if you are gardening or working outdoors.  If an attack by a stinging insect or a swarm seems likely in the moment, move away from the area or find a barrier between you and the insect(s), such as a door. Contact a health care provider if:  Your symptoms do not get better in 2-3 days.  You have redness, swelling, or pain that spreads beyond the area of the sting.  You have a fever. Get help right away if: You have symptoms of a severe allergic reaction. These include:  Wheezing or difficulty breathing.  Tightness in the chest or chest pain.  Light-headedness or fainting.  Itchy, raised, red patches on the skin.  Nausea or vomiting.  Abdominal cramping.  Diarrhea.  A swollen tongue or lips, or trouble swallowing.  Dizziness or fainting.  Summary  Stings from bees, wasps, and hornets can cause pain and inflammation, but they are usually not serious. However, some people may have an allergic reaction to a sting. This can cause the symptoms to be more severe.  Pay close attention to your symptoms after you have been stung. If possible, have someone stay with you to make sure you do not have an allergic reaction.  Call your health care provider if you have any signs of an allergic reaction. This information is not intended to replace advice given to you by your health care provider. Make sure you discuss any questions you have with your health care provider. Document Released: 04/27/2005 Document Revised: 07/02/2016 Document Reviewed: 07/02/2016 Elsevier Interactive Patient Education  [employer].

## 2016-11-16 NOTE — Telephone Encounter (Signed)
Pt has appt with Wilfred Lacy NP 11/16/16 at 11:30.

## 2016-11-16 NOTE — Progress Notes (Signed)
Subjective:  Patient ID: Thomas Sloan, male    DOB: 01/10/1966  Age: 51 y.o. MRN: [id number]  CC: Insect Bite (wasp stings) and Insect Bite (patient mowing yard this weekend, experienced several wasp stings, especially in right arm/elbow area and right lower calf area----has remained swollen and itching---has tried benadryl PO and topical, along with lanocaine spray and ice, nothing really working----more concerned about swelling not going away in his right lower leg)   Rash  This is a new problem. The current episode started in the past 7 days. The problem has been gradually worsening since onset. The affected locations include the left lower leg, left upper leg, right lower leg and right upper leg. The rash is characterized by burning, pain, redness, swelling and itchiness. He was exposed to an insect bite/sting. Pertinent negatives include no anorexia, congestion, cough, eye pain, facial edema, fever, joint pain or shortness of breath. Past treatments include anti-itch cream and antihistamine. The treatment provided no relief.    Outpatient Medications Prior to Visit  Medication Sig Dispense Refill  . buPROPion (WELLBUTRIN XL) 150 MG 24 hr tablet Take 3 tablets (450 mg total) by mouth daily. 270 tablet 3  . finasteride (PROSCAR) 5 MG tablet Take 1/4 tablet by mouth every other day 30 tablet 3  . metoprolol tartrate (LOPRESSOR) 25 MG tablet Take 0.5-1 tablets (12.5-25 mg total) by mouth 2 (two) times daily as needed (For public speaking). (Patient taking differently: Take 12.5-25 mg by mouth 2 (two) times daily as needed (For public speaking). ) 30 tablet 1  . omeprazole (PRILOSEC) 20 MG capsule TAKE 1 CAPSULE BY MOUTH EVERY DAY 90 capsule 3   No facility-administered medications prior to visit.     ROS See HPI  Objective:  BP 128/78   Pulse 72   Temp 98.4 F (36.9 C)   Wt 248 lb (112.5 kg)   SpO2 99%   BMI 34.59 kg/m   BP Readings from Last 3 Encounters:  11/16/16 128/78    03/13/16 122/86  02/07/15 112/70    Wt Readings from Last 3 Encounters:  11/16/16 248 lb (112.5 kg)  03/13/16 242 lb 8 oz (110 kg)  02/07/15 239 lb 12 oz (108.7 kg)    Physical Exam  Constitutional: He is oriented to person, place, and time. No distress.  Cardiovascular: Normal rate.   Pulmonary/Chest: No respiratory distress.  Musculoskeletal: He exhibits edema and tenderness.  Neurological: He is alert and oriented to person, place, and time.  Skin: Skin is warm and dry. Rash noted. Rash is urticarial. There is erythema.     Vitals reviewed.   Lab Results  Component Value Date   PLT 174 03/03/2016   CHOL 170 03/03/2016   TRIG 123 03/03/2016   HDL 40 03/03/2016   LDLCALC 102 01/16/2015   ALT 22 03/03/2016   AST 22 03/03/2016   CREATININE 1.24 03/03/2016    No results found.  Assessment & Plan:   Lavaris was seen today for insect bite and insect bite.  Diagnoses and all orders for this visit:  Allergic reaction to insect sting, accidental or unintentional, initial encounter -     methylPREDNISolone acetate (DEPO-MEDROL) injection 80 mg; Inject 1 mL (80 mg total) into the muscle once. -     methylPREDNISolone (MEDROL DOSEPAK) 4 MG TBPK tablet; Take as directed on package -     diphenhydrAMINE (BENADRYL) 25 MG tablet; Take 1 tablet (25 mg total) by mouth every 8 (eight) hours as needed. -  ranitidine (ZANTAC) 150 MG tablet; Take 1 tablet (150 mg total) by mouth 2 (two) times daily. -     methylPREDNISolone acetate (DEPO-MEDROL) injection 80 mg; Inject 1 mL (80 mg total) into the muscle once.   I am having Mr. Sevin start on methylPREDNISolone, diphenhydrAMINE, and ranitidine. I am also having him maintain his buPROPion, finasteride, omeprazole, and metoprolol tartrate. We administered methylPREDNISolone acetate. We will continue to administer methylPREDNISolone acetate.  Meds ordered this encounter  Medications  . methylPREDNISolone acetate (DEPO-MEDROL)  injection 80 mg  . methylPREDNISolone (MEDROL DOSEPAK) 4 MG TBPK tablet    Sig: Take as directed on package    Dispense:  21 tablet    Refill:  0    Order Specific Question:   Supervising Provider    Answer:   Cassandria Anger [[id number]]  . diphenhydrAMINE (BENADRYL) 25 MG tablet    Sig: Take 1 tablet (25 mg total) by mouth every 8 (eight) hours as needed.    Dispense:  30 tablet    Refill:  0    Order Specific Question:   Supervising Provider    Answer:   Cassandria Anger [[id number]]  . ranitidine (ZANTAC) 150 MG tablet    Sig: Take 1 tablet (150 mg total) by mouth 2 (two) times daily.    Dispense:  14 tablet    Refill:  0    Order Specific Question:   Supervising Provider    Answer:   Cassandria Anger [[id number]]  . methylPREDNISolone acetate (DEPO-MEDROL) injection 80 mg    Follow-up: Return if symptoms worsen or fail to improve.  Wilfred Lacy, NP

## 2016-11-16 NOTE — Telephone Encounter (Signed)
Patient Name: Thomas Sloan DOB: [birth year] Initial Comment Caller states that he was stung numerous times by wasps all over his body, no allergic symptoms Nurse Assessment Nurse: Vallery Sa, RN, [place] Date/Time (Eastern Time): 11/16/2016 9:22:11 AM Confirm and document reason for call. If symptomatic, describe symptoms. ---[name] states that he obtain about 5 wasp stings on his back, legs and arms about 48 hours ago. No severe breathing or swallowing difficulty. No fever. Alert and responsive. Does the patient have any new or worsening symptoms? ---Yes Will a triage be completed? ---Yes Related visit to physician within the last 2 weeks? ---No Does the PT have any chronic conditions? (i.e. diabetes, asthma, etc.) ---Yes List chronic conditions. ---Depression, Acid Reflux Is this a behavioral health or substance abuse call? ---No Guidelines Guideline Title Affirmed Question Affirmed Notes Bee or Yellow Jacket Sting [1] Red or very tender (to touch) area AND [2] started over 24 hours after the sting Final Disposition User See Physician within 24 Hours [place], RN, International aid/development worker Comments Scheduled at 11:30am today with [place], NP at [facility] office. Referrals REFERRED TO PCP OFFICE Disagree/Comply: Comply

## 2017-03-13 ENCOUNTER — Other Ambulatory Visit: Payer: Self-pay | Admitting: Family Medicine

## 2017-03-15 ENCOUNTER — Other Ambulatory Visit: Payer: Self-pay | Admitting: *Deleted

## 2017-03-15 MED ORDER — OMEPRAZOLE 20 MG PO CPDR: DELAYED_RELEASE_CAPSULE | ORAL | 0 refills | Status: DC

## 2017-03-17 LAB — HEPATIC FUNCTION PANEL
ALT: 28 (ref 10–40)
AST: 24 (ref 14–40)

## 2017-03-17 LAB — BASIC METABOLIC PANEL
Creatinine: 1.4 — AB (ref 0.6–1.3)
GLUCOSE: 91

## 2017-03-17 LAB — LIPID PANEL
Cholesterol: 190 (ref 0–200)
HDL: 41 (ref 35–70)
LDL CALC: 123
Triglycerides: 132 (ref 40–160)

## 2017-04-16 ENCOUNTER — Ambulatory Visit: Payer: BC Managed Care – PPO | Admitting: Family Medicine

## 2017-04-16 ENCOUNTER — Encounter: Payer: Self-pay | Admitting: Family Medicine

## 2017-04-16 VITALS — BP 110/76 | HR 73 | Temp 98.3°F | Ht 71.0 in | Wt 243.2 lb

## 2017-04-16 DIAGNOSIS — K219 Gastro-esophageal reflux disease without esophagitis: Secondary | ICD-10-CM | POA: Diagnosis not present

## 2017-04-16 DIAGNOSIS — F3342 Major depressive disorder, recurrent, in full remission: Secondary | ICD-10-CM

## 2017-04-16 DIAGNOSIS — Z0000 Encounter for general adult medical examination without abnormal findings: Secondary | ICD-10-CM

## 2017-04-16 DIAGNOSIS — E663 Overweight: Secondary | ICD-10-CM | POA: Diagnosis not present

## 2017-04-16 DIAGNOSIS — Z8042 Family history of malignant neoplasm of prostate: Secondary | ICD-10-CM | POA: Diagnosis not present

## 2017-04-16 DIAGNOSIS — Z125 Encounter for screening for malignant neoplasm of prostate: Secondary | ICD-10-CM | POA: Diagnosis not present

## 2017-04-16 DIAGNOSIS — Z7189 Other specified counseling: Secondary | ICD-10-CM

## 2017-04-16 LAB — PSA: PSA: 0.5 ng/mL (ref 0.10–4.00)

## 2017-04-16 MED ORDER — NALTREXONE HCL 50 MG PO TABS: 25.0000 mg | ORAL_TABLET | Freq: Every day | ORAL | Status: DC

## 2017-04-16 MED ORDER — BUPROPION HCL ER (XL) 150 MG PO TB24: 450.0000 mg | ORAL_TABLET | Freq: Every day | ORAL | 3 refills | Status: DC

## 2017-04-16 MED ORDER — NALTREXONE HCL 50 MG PO TABS: 25.0000 mg | ORAL_TABLET | Freq: Every day | ORAL | 0 refills | Status: DC

## 2017-04-16 MED ORDER — OMEPRAZOLE 20 MG PO CPDR: DELAYED_RELEASE_CAPSULE | ORAL | 3 refills | Status: DC

## 2017-04-16 NOTE — Progress Notes (Signed)
Overweight.  He is working on his weight.  Had been up to 253, down to 243 today.  We talked about prev naltrexone use.  He had been off med, but restarted.  He has still been on wellbutrin.  He can continue as is and update me about his weight in about 2 months.  We talked about his motivation and control with eating.    BB helps with public speaking.  Used prn, no ADE on med.    GERD improved historically with weight loss.  Needing less zantac with weight loss.  Still on PPI.    Labs done at work, d/w pt.  Reviewed with patient.  See scanned notes.   He still sees derm yearly.   Colonoscopy up to date.  D/w pt.   If incapacitated, would have wife designated.   Father with prostate cancer.  D/w pt.  Recent dx.  D/w pt about checking PSA with partial change related to finasteride.    Mood is okay.  He has more trouble with season changes, d/w pt.  Still in counseling.  No SI/HI.    PMH and SH reviewed  ROS: Per HPI unless specifically indicated in ROS section   Meds, vitals, and allergies reviewed.   GEN: nad, alert and oriented HEENT: mucous membranes moist NECK: supple w/o LA CV: rrr. PULM: ctab, no inc wob ABD: soft, +bs EXT: no edema SKIN: no acute rash

## 2017-04-16 NOTE — Patient Instructions (Addendum)
Go to the lab on the way out.  We'll contact you with your lab report. Don't change your meds for now.  Update me in about 2 months about your weight and how you feel.  Take care.  Glad to see you.

## 2017-04-21 DIAGNOSIS — Z0000 Encounter for general adult medical examination without abnormal findings: Secondary | ICD-10-CM | POA: Insufficient documentation

## 2017-04-21 DIAGNOSIS — Z8042 Family history of malignant neoplasm of prostate: Secondary | ICD-10-CM | POA: Insufficient documentation

## 2017-04-21 NOTE — Assessment & Plan Note (Signed)
Still in counseling.  No suicidal or homicidal intent.  Okay for outpatient follow-up.  Still technically in remission though he does have some worsening of symptoms with seasonal changes, especially in the winter.  Discussed with patient.  Continue current medications.  Update me as needed.  He agrees.

## 2017-04-21 NOTE — Assessment & Plan Note (Signed)
If incapacitated, would have wife designated.

## 2017-04-21 NOTE — Assessment & Plan Note (Signed)
Labs done at work, d/w pt.  Reviewed with patient.  See scanned notes.   He still sees derm yearly.   Colonoscopy up to date.  D/w pt.   If incapacitated, would have wife designated.

## 2017-04-21 NOTE — Assessment & Plan Note (Addendum)
Continue work on diet and exercise.  Continue Wellbutrin.  Continue naltrexone as is.  He had already been taking Wellbutrin and this dosing is similar to use of contrave.  He will update me in about 2 months, sooner if needed.  Discussed with patient about motivation for eating and trigger foods/situations.   >25 minutes spent in face to face time with patient, >50% spent in counselling or coordination of care regarding his considerations.  See above.

## 2017-04-21 NOTE — Assessment & Plan Note (Signed)
GERD improved historically with weight loss.  Needing less zantac with weight loss.  Still on PPI.  Continue work on weight loss.  Discussed with patient.  Continue medications as is.

## 2017-04-21 NOTE — Assessment & Plan Note (Signed)
Father with prostate cancer.  D/w pt.  Recent dx in his father.  D/w pt about checking PSA with partial change related to finasteride.   See notes on labs.  No LUTS in patient.

## 2017-05-21 ENCOUNTER — Encounter: Payer: Self-pay | Admitting: Family Medicine

## 2017-05-23 ENCOUNTER — Other Ambulatory Visit: Payer: Self-pay | Admitting: Family Medicine

## 2017-05-23 MED ORDER — AMOXICILLIN-POT CLAVULANATE 875-125 MG PO TABS: 1.0000 | ORAL_TABLET | Freq: Two times a day (BID) | ORAL | 0 refills | Status: DC

## 2017-05-29 ENCOUNTER — Encounter: Payer: Self-pay | Admitting: Family Medicine

## 2017-05-30 ENCOUNTER — Telehealth: Payer: BC Managed Care – PPO | Admitting: Family

## 2017-05-30 DIAGNOSIS — R0602 Shortness of breath: Secondary | ICD-10-CM

## 2017-05-30 DIAGNOSIS — R6889 Other general symptoms and signs: Secondary | ICD-10-CM

## 2017-05-30 NOTE — Progress Notes (Signed)
Based on what you shared with me it looks like you have a serious condition that should be evaluated in a face to face office visit.  NOTE: If you entered your credit card information for this eVisit, you will not be charged. You may see a "hold" on your card for the $30 but that hold will drop off and you will not have a charge processed.  If you are having a true medical emergency please call 911.  If you need an urgent face to face visit, [facility] has four urgent care centers for your convenience.  If you need care fast and have a high deductible or no insurance consider:   [email or web address] to reserve your spot online an avoid wait times  [facility] [address], [address] [place], [place] [address] 8 am to 8 pm Monday-Friday 10 am to 4 pm Saturday-Sunday *Across the street from [employer]  [facility]  [address] [place], [address] 8 am to 5 pm Monday-Friday * In the [facility] the [facility]   The following sites will take your  insurance:  . [facility]  [phone] Get Driving Directions Find a Provider at this Location  [address] [place], [place] [address] . 10 am to 8 pm Monday-Friday . 12 pm to 8 pm Saturday-Sunday   . [facility]  [phone] Get Driving Directions Find a Provider at this Location  [address], [address] [place], [place] [address] . 8 am to 8 pm Monday-Friday . 9 am to 6 pm Saturday . 11 am to 6 pm Sunday   . [facility]  [phone] Get Driving Directions  [address] [place], [place] [address] . 8 am to 8 pm Monday-Friday . 8 am to 4 pm Saturday-Sunday   Your e-visit answers were reviewed by a board certified advanced clinical practitioner to complete your personal care plan.  Thank you for using e-Visits.  

## 2017-05-31 ENCOUNTER — Telehealth: Payer: Self-pay | Admitting: Family Medicine

## 2017-05-31 NOTE — Telephone Encounter (Signed)
Call pt.  It looks like he had an attempted E visit after the mychart message was sent.  I didn't see the mychart message until today.  Per EMR it was rec'd that he get face to face visit.  Please check on patient and update me.  Did he get seen?  What is his status?  Thanks.

## 2017-05-31 NOTE — Telephone Encounter (Signed)
Left detailed message on voicemail to return call. 

## 2017-06-01 NOTE — Telephone Encounter (Signed)
Patient says he was not seen for an OV anywhere because he started to feel better.  He thinks he is getting better now.

## 2017-07-08 ENCOUNTER — Ambulatory Visit: Payer: Self-pay

## 2017-07-08 MED ORDER — NALTREXONE HCL 50 MG PO TABS: 25.0000 mg | ORAL_TABLET | ORAL | 0 refills | Status: DC

## 2017-07-08 NOTE — Telephone Encounter (Signed)
Patient called in with medication questions about Naltroxone. He says "Dr. [name] prescribed Naltroxone to take with my Wellbutrin for 2 months to help with weight loss. I stopped the medication about 24-48 hours ago and I have a headache and nausea. I feel crappy, like I feel if I am off my Wellbutrin. Is this a side effect from coming off the medicine and I will just have to ride it out or should I have tapered off the medicine? I called the pharmacy to get a refill and they may call to get it filled. I did that just in case I need to start back taking it." I asked how severe are the nausea and headache, he said "moderate, I had to stop working. I haven't vomited, just nauseated."  I advised this would be sent to Dr. [name] for review and someone will be back in touch with his recommendation. Spoke to [place] at the office to alert Dr. [name], she verbalized understanding.   Reason for Disposition . Caller has URGENT medication question about med that PCP prescribed and triager unable to answer question  Answer Assessment - Initial Assessment Questions 1. SYMPTOMS: "Do you have any symptoms?"     Yes, nausea and headache, tired 2. SEVERITY: If symptoms are present, ask "Are they mild, moderate or severe?"     Moderate  Protocols used: MEDICATION QUESTION CALL-A-AH

## 2017-07-08 NOTE — Telephone Encounter (Signed)
Spoke with pt relaying instructions and message per Dr. [name].  Pt verbalizes understanding and expresses his thanks.

## 2017-07-08 NOTE — Telephone Encounter (Signed)
I would restart the naltrexone and then taper off.  Would take 1 tab every other day for 5 doses then try to stop.  If feeling worse in the meantime then update me.  rx sent.

## 2017-09-01 ENCOUNTER — Encounter: Payer: Self-pay | Admitting: Family Medicine

## 2017-09-01 ENCOUNTER — Other Ambulatory Visit: Payer: Self-pay | Admitting: Family Medicine

## 2017-09-01 NOTE — Telephone Encounter (Signed)
Electronic refill request Last office visit 04/16/17 Last refill 07/08/17 #10

## 2017-09-02 ENCOUNTER — Other Ambulatory Visit: Payer: Self-pay | Admitting: Family Medicine

## 2017-09-03 NOTE — Telephone Encounter (Signed)
Electronic refill request. Naltrexone Last office visit:   04/16/17 Last Filled:    10 tablet 0 07/08/2017  Please advise.

## 2017-09-05 NOTE — Telephone Encounter (Signed)
Sent via other refill request.  Thanks.

## 2017-09-05 NOTE — Telephone Encounter (Signed)
Sent. Thanks.   

## 2017-09-07 ENCOUNTER — Encounter: Payer: Self-pay | Admitting: Family Medicine

## 2017-09-08 ENCOUNTER — Other Ambulatory Visit: Payer: Self-pay | Admitting: Family Medicine

## 2017-09-08 ENCOUNTER — Telehealth: Payer: Self-pay | Admitting: Family Medicine

## 2017-09-08 NOTE — Telephone Encounter (Signed)
Copied from [place] [id number]. Topic: Quick Communication - See Telephone Encounter >> Sep 08, 2017  3:29 PM Boyd Kerbs wrote: CRM for notification. See Telephone encounter for: 09/08/17.  [facility] had a power outage on the 28th and saying they did not receive the prescription naltrexone (DEPADE) 50 MG tablet  Please re-send

## 2017-09-09 MED ORDER — NALTREXONE HCL 50 MG PO TABS: 25.0000 mg | ORAL_TABLET | ORAL | 0 refills | Status: DC

## 2017-09-10 ENCOUNTER — Other Ambulatory Visit: Payer: Self-pay | Admitting: Family Medicine

## 2017-09-10 MED ORDER — NALTREXONE HCL 50 MG PO TABS: 25.0000 mg | ORAL_TABLET | Freq: Every day | ORAL | 0 refills | Status: DC

## 2017-09-10 NOTE — Telephone Encounter (Signed)
Please call pharmacy.  This was my error.  He received 10 of the 50mg  pills.  He needs to get a total of 15.   He is taking 1/2 tab a day, so he needs the balance to make a 1 month supply.  Will you have the pharmacy disp the extra 5 pills? I updated the rx but didn't resend yet.   Thanks.  I sent mychart message to patient.

## 2017-09-10 NOTE — Telephone Encounter (Signed)
Yes, I sent the refill.  Thanks. I sent the patient a note.

## 2017-09-10 NOTE — Telephone Encounter (Signed)
Patient says thru MyChart message that it would be fine to just send a refill.  Is this the easiest method?

## 2017-11-29 ENCOUNTER — Other Ambulatory Visit: Payer: Self-pay | Admitting: Family Medicine

## 2018-01-06 ENCOUNTER — Encounter: Payer: Self-pay | Admitting: Family Medicine

## 2018-01-06 ENCOUNTER — Ambulatory Visit: Payer: BC Managed Care – PPO | Admitting: Family Medicine

## 2018-01-06 DIAGNOSIS — E663 Overweight: Secondary | ICD-10-CM | POA: Diagnosis not present

## 2018-01-06 MED ORDER — NALTREXONE HCL 50 MG PO TABS: 25.0000 mg | ORAL_TABLET | Freq: Every day | ORAL | 1 refills | Status: DC

## 2018-01-06 MED ORDER — FINASTERIDE 5 MG PO TABS: ORAL_TABLET | ORAL | 3 refills | Status: DC

## 2018-01-06 NOTE — Progress Notes (Signed)
He is in the field more and driving more.  GERD, knee pain, back pain worse with weight gain.  He is binge eating off naltrexone.  Naltrexone prev helped control urges to eat excessively.  D/w pt about diet and exercise and routine cautions with med.  Still on wellbutrin.  Mood d/w pt.  Mood is "pretty good."  Noted that, "I get depressed about binge eating."  No SI/HI.  Guilt associated with binging.  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  No desire to drink.    Rare use of metoprolol for public speaking.  No ADE on med.  It helped.  He went through hypnosis to help with public speaking.   Meds, vitals, and allergies reviewed.   ROS: Per HPI unless specifically indicated in ROS section   GEN: nad, alert and oriented HEENT: mucous membranes moist NECK: supple w/o LA CV: rrr.  PULM: ctab, no inc wob ABD: soft, +bs EXT: no edema SKIN: Well-perfused

## 2018-01-06 NOTE — Patient Instructions (Signed)
Restart naltrexone midday. Start with every other day dosing initially.   Update me as needed.  Take care.  Glad to see you.

## 2018-01-10 NOTE — Assessment & Plan Note (Signed)
We talked about options.  He has comorbid conditions, GERD, knee pain, back pain, that are worse with increase in weight.  He previously responded to combination of Wellbutrin and naltrexone.  No adverse effect on medications previously.  We talked about options for treatment overall.  He will continue work on diet and exercise. Restart naltrexone midday. Start with every other day dosing initially.  Update me as needed.  He agrees with plan.

## 2018-04-25 ENCOUNTER — Encounter: Payer: Self-pay | Admitting: Family Medicine

## 2018-04-25 ENCOUNTER — Ambulatory Visit: Payer: BC Managed Care – PPO | Admitting: Family Medicine

## 2018-04-25 VITALS — BP 118/80 | HR 77 | Temp 98.1°F | Ht 71.0 in | Wt 233.5 lb

## 2018-04-25 DIAGNOSIS — Z8042 Family history of malignant neoplasm of prostate: Secondary | ICD-10-CM

## 2018-04-25 DIAGNOSIS — Z125 Encounter for screening for malignant neoplasm of prostate: Secondary | ICD-10-CM | POA: Diagnosis not present

## 2018-04-25 DIAGNOSIS — L659 Nonscarring hair loss, unspecified: Secondary | ICD-10-CM

## 2018-04-25 DIAGNOSIS — E663 Overweight: Secondary | ICD-10-CM | POA: Diagnosis not present

## 2018-04-25 DIAGNOSIS — K219 Gastro-esophageal reflux disease without esophagitis: Secondary | ICD-10-CM

## 2018-04-25 NOTE — Patient Instructions (Signed)
Thank you for your effort.   Go to the lab on the way out.  We'll contact you with your lab report. Update me as needed.  Take care.  Glad to see you.

## 2018-04-25 NOTE — Progress Notes (Signed)
Follow-up for weight management.  Still on wellbutrin and naloxone.  He feels better at lower weight with less joint pain and less GERD.  Diet and exercise d/w pt.  He is working on both. Labs d/w pt, mild hyperglycemia. he is able to manage his appetite cravings better with current medications.  He has been able to lose weight effectively with medication.  No adverse effect.  He is doing well in general.  Work is going well.  His mood is good.  Prostate cancer screening and PSA options (with potential risks and benefits of testing vs not testing) were discussed along with recent recs/guidelines.  He accepted testing PSA at this point.  Meds, vitals, and allergies reviewed.   ROS: Per HPI unless specifically indicated in ROS section   GEN: nad, alert and oriented HEENT: mucous membranes moist NECK: supple w/o LA CV: rrr. PULM: ctab, no inc wob ABD: soft, +bs EXT: no edema SKIN: no acute rash

## 2018-04-26 LAB — PSA: PSA: 0.45 ng/mL (ref 0.10–4.00)

## 2018-04-26 NOTE — Assessment & Plan Note (Signed)
He is able to manage his appetite cravings better with current medications.  He is losing weight appropriately.  Discussed diet and exercise.  No change in meds at this point.  He will update me as needed.  Okay for outpatient follow-up. >15 minutes spent in face to face time with patient, >50% spent in counselling or coordination of care

## 2018-04-26 NOTE — Assessment & Plan Note (Signed)
Recheck PSA pending.  Discussed pros and cons of PSA testing.  Reasonable to check given his family history.  Note that he is on a low dose of finasteride which could theoretically lower his PSA some.  I would not expect that to lower his PSA results by a full 50%.

## 2018-04-26 NOTE — Assessment & Plan Note (Signed)
Improved with weight loss 

## 2018-04-26 NOTE — Assessment & Plan Note (Signed)
Still on low-dose of finasteride, see PSA screening discussion.  No adverse effect on medication.

## 2018-05-05 ENCOUNTER — Encounter: Payer: Self-pay | Admitting: Family Medicine

## 2018-05-05 LAB — LAB REPORT - SCANNED
Cholesterol: 160
Creatinine, Ser: 1.42
Glucose: 101
HDL: 41
LDL (calc): 98
SGOT(AST): 20
SGPT (ALT): 17
Triglycerides: 104 (ref 40–160)

## 2018-05-18 ENCOUNTER — Other Ambulatory Visit: Payer: Self-pay | Admitting: Family Medicine

## 2018-07-06 ENCOUNTER — Other Ambulatory Visit: Payer: Self-pay | Admitting: Family Medicine

## 2018-07-06 NOTE — Telephone Encounter (Signed)
Sent. Thanks.   

## 2018-07-06 NOTE — Telephone Encounter (Signed)
Electronic refill request Depade Last refill 01/06/18 #45/1 Last office visit 04/25/18

## 2018-09-12 ENCOUNTER — Encounter: Payer: Self-pay | Admitting: Family Medicine

## 2018-10-20 ENCOUNTER — Encounter: Payer: Self-pay | Admitting: Family Medicine

## 2018-10-20 NOTE — Telephone Encounter (Signed)
Pt has called back in after hrs and is in a lot of pain, making sure  Message was received FU at [phone]

## 2018-10-21 ENCOUNTER — Telehealth: Payer: Self-pay | Admitting: Family Medicine

## 2018-10-21 ENCOUNTER — Encounter: Payer: Self-pay | Admitting: Family Medicine

## 2018-10-21 ENCOUNTER — Telehealth: Payer: Self-pay

## 2018-10-21 ENCOUNTER — Ambulatory Visit: Payer: BC Managed Care – PPO | Admitting: Family Medicine

## 2018-10-21 ENCOUNTER — Other Ambulatory Visit: Payer: Self-pay

## 2018-10-21 DIAGNOSIS — M5441 Lumbago with sciatica, right side: Secondary | ICD-10-CM | POA: Insufficient documentation

## 2018-10-21 MED ORDER — TRAMADOL HCL 50 MG PO TABS: 50.0000 mg | ORAL_TABLET | Freq: Three times a day (TID) | ORAL | 0 refills | Status: AC | PRN

## 2018-10-21 MED ORDER — CYCLOBENZAPRINE HCL 10 MG PO TABS: 5.0000 mg | ORAL_TABLET | Freq: Every evening | ORAL | 0 refills | Status: DC | PRN

## 2018-10-21 MED ORDER — PREDNISONE 20 MG PO TABS: ORAL_TABLET | ORAL | 0 refills | Status: DC

## 2018-10-21 MED ORDER — HYDROCODONE-ACETAMINOPHEN 5-325 MG PO TABS: 1.0000 | ORAL_TABLET | Freq: Four times a day (QID) | ORAL | 0 refills | Status: AC | PRN

## 2018-10-21 NOTE — Telephone Encounter (Signed)
Pt's wife called triage line (Not on DPR) stating pt is in excruciating pain even after taking a Flexeril, Tramadol, and Prednisone.I spoke to the Pt and he gave permission to speak to wife today. Asking what else he can do for his pain. Does not want to go to the ER if possible. She said he has never been in this much pain.

## 2018-10-21 NOTE — Assessment & Plan Note (Signed)
Treat with prednisone, heat, massage, home PT. Can use tramadol or muscle relaxant prn.

## 2018-10-21 NOTE — Telephone Encounter (Signed)
Patient stated that he was prescribed prednisone today at his virtual visit.  He was told not to take the prednisone and ibuprofen at the same time. Since he just took the Ibuprofen this morning he would like to know how long he should wait before taking the prednisone   PATIENT PHONE- [phone]

## 2018-10-21 NOTE — Addendum Note (Signed)
Addended by: Eliezer Lofts E on: 10/21/2018 05:38 PM   Modules accepted: Orders

## 2018-10-21 NOTE — Telephone Encounter (Signed)
It is okay to take the prednisone now.

## 2018-10-21 NOTE — Telephone Encounter (Signed)
[place] Night - Client TELEPHONE ADVICE RECORD AccessNurse Patient Name: Thomas Sloan Gender: Male DOB: 1966-03-09 Age: 53 Y 9 M 30 D Return Phone Number: [phone] (Primary) Address: City/State/Zip: [place] Client [place] Night - Client Client Site [place] Physician Renford Dills - MD Contact Type Call Who Is Calling Patient / Member / Family / Caregiver Call Type Triage / Clinical Relationship To Patient Self Return Phone Number [phone] (Primary) Chief Complaint Leg Pain Reason for Call Symptomatic / Request for [name] states her husband is having right leg and lower back pain. Translation No Nurse Assessment Nurse: Renne Crigler, RN, Sherlie Ban Date/Time (Eastern Time): 10/20/2018 7:21:07 PM Confirm and document reason for call. If symptomatic, describe symptoms. ---Caller states having lower back pain. First started as lower back, now has shooting and burning pain down right leg. Has been having general weakness in the right leg for a while. Been putting off being seen by PCP d/t COVID. Was moving a ladder and thinks that started the lower back pain. Believes this is sciatic pain. States leg pain began yesterday evening. Has the patient had close contact with a person known or suspected to have the novel coronavirus illness OR traveled / lives in area with major community spread (including international travel) in the last 14 days from the onset of symptoms? * If Asymptomatic, screen for exposure and travel within the last 14 days. ---No Does the patient have any new or worsening symptoms? ---Yes Will a triage be completed? ---Yes Related visit to physician within the last 2 weeks? ---No Does the PT have any chronic conditions? (i.e. diabetes, asthma, this includes High risk factors for pregnancy, etc.) ---Yes List chronic conditions. ---acid reflux Is the  patient pregnant or possibly pregnant? (Ask all females between the ages of 64-55) ---No Is this a behavioral health or substance abuse call? ---No Guidelines Guideline Title Affirmed Question Affirmed Notes Nurse Date/Time (Eastern Time) Leg Pain [1] SEVERE pain (e.g., excruciating, unable to [place], RN, Sherlie Ban 10/20/2018 7:26:14 PM PLEASE NOTE: All timestamps contained within this report are represented as [place] Standard Time. CONFIDENTIALTY NOTICE: This fax transmission is intended only for the addressee. It contains information that is legally privileged, confidential or otherwise protected from use or disclosure. If you are not the intended recipient, you are strictly prohibited from reviewing, disclosing, copying using or disseminating any of this information or taking any action in reliance on or regarding this information. If you have received this fax in error, please notify us immediately by telephone so that we can arrange for its return to [place]. Phone: [phone], Toll-Free: [phone], Fax: [phone] Page: 2 of 2 Call Id: [id number] Guidelines Guideline Title Affirmed Question Affirmed Notes Nurse Date/Time Eilene Ghazi Time) do any normal activities) AND [2] not improved after 2 hours of pain medicine Disp. Time Eilene Ghazi Time) Disposition Final User 10/20/2018 7:35:31 PM See HCP within 4 Hours (or PCP triage) Yes [place], RN, Melina Schools Disagree/Comply Disagree Caller Understands Yes PreDisposition Did not know what to do Care Advice Given Per Guideline SEE HCP WITHIN 4 HOURS (OR PCP TRIAGE): * IF OFFICE WILL BE CLOSED AND NO PCP (PRIMARY CARE PROVIDER) SECOND-LEVEL TRIAGE: You need to be seen within the next 3 or 4 hours. A nearby Urgent Care Center [facility]) is often a good source of care. Another choice is to go to the ED. Go sooner if you become worse. PAIN MEDICINES: * For pain  relief, take acetaminophen, ibuprofen, or naproxen. CARE ADVICE given per Leg Pain  (Adult) guideline. CALL BACK IF: * You become worse. Wants to wait and see PCP in the morning. Doesn't want to go to UC d/t COVID. Comments User: Thomas Orn, RN Date/Time Eilene Ghazi Time): 10/20/2018 7:26:12 PM States pain is a 6-7 and sometimes up to a 10 on a scale from 1-10. Describes as a dull cramp. Referrals REFERRED TO PCP OFFICE

## 2018-10-21 NOTE — Telephone Encounter (Signed)
Left message for Thomas Sloan that he can start the prednisone now per Dr. Diona Browner.

## 2018-10-21 NOTE — Telephone Encounter (Signed)
[place] Night - Client Nonclinical Telephone Record [facility]urse Client [facility] Night - Client Client Site [place] Physician Renford Dills - MD Contact Type Call Who Is Calling Patient / Member / Family / Caregiver Caller Name Thomas Sloan Caller Phone Number [phone] Reason for Call Symptomatic / Request for [name] states having leg pain and has been constant for over 24 hrs; beginning to feel sick to stomach; Wants relief; Virtual appt would be great; pls call him; spoke w/after hours last night and left messages on mychart. Next move is ED; Caller declined triage. Call Closed By: Jerrye Beavers Transaction Date/Time: 10/21/2018 7:35:50 AM (ET)

## 2018-10-21 NOTE — Telephone Encounter (Signed)
Reviewed [place].. no red flags. Will call in hydrocodone/apap for pain.  Spoke with patient and answer questions.  Stop naltrexone for weight loss as it may inhibit pain med effect. If not improving consider ER visit. If not better by early next week.. consider in office visit for X-ray and further eval.

## 2018-10-21 NOTE — Telephone Encounter (Signed)
Per pts appt notes pt already has appt 10/21/18 8:20 appt with Dr Diona Browner.

## 2018-10-21 NOTE — Progress Notes (Signed)
   Subjective:    Patient ID: Thomas Sloan, male    DOB: [birth year], 53 y.o.   MRN: [id number] Chief Complaint  Patient presents with  . Sciatica    HPI 53 year old male pt of Dr. Josefine Sloan presents with new onset low back pain to right  leg.  Starting in March he noted weakness in right leg, started having low back pain. Gradually worsening in last few weeks.   Right leg and buttock feels like burning pain radiates all the way to his right calf.[name] get comfortable. 6-7/10 on pain, occ severe 10/10 Massage helped low back but not leg pain.  Has been doing some heat,  stretching and ibuprofen/tylenol... no relief. Pain worse with walking.  No known falls... was proceeded with cleaning gutters.  No numbness, but feels right leg weakness, no foot drop.  No past back surgery or past back problems.    Social History /Family History/Past Medical History reviewed in detail and updated in EMR if needed. Blood pressure (!) 140/100, pulse 69, temperature 97.6 F (36.4 C), temperature source Other (Comment), height 5\' 11"  (1.803 m), weight 244 lb 12 oz (111 kg).  Review of Systems  Constitutional: Negative for fatigue and fever.  HENT: Negative for ear pain.   Eyes: Negative for pain.  Respiratory: Negative for cough and shortness of breath.   Cardiovascular: Negative for chest pain, palpitations and leg swelling.  Gastrointestinal: Negative for abdominal pain.  Genitourinary: Negative for dysuria.  Musculoskeletal: Negative for arthralgias.  Neurological: Negative for syncope, light-headedness and headaches.  Psychiatric/Behavioral: Negative for dysphoric mood.       Objective:   Physical Exam Constitutional:      Appearance: He is well-developed.  HENT:     Head: Normocephalic.     Right Ear: Hearing normal.     Left Ear: Hearing normal.     Nose: Nose normal.  Neck:     Thyroid: No thyroid mass or thyromegaly.     Vascular: No carotid bruit.     Trachea: Trachea  normal.  Cardiovascular:     Rate and Rhythm: Normal rate and regular rhythm.     Pulses: Normal pulses.     Heart sounds: Heart sounds not distant. No murmur. No friction rub. No gallop.      Comments: No peripheral edema Pulmonary:     Effort: Pulmonary effort is normal. No respiratory distress.     Breath sounds: Normal breath sounds.  Musculoskeletal:     Lumbar back: He exhibits decreased range of motion and tenderness. He exhibits no bony tenderness.     Comments: ttp in right sciatic notch, positive SLR  neg faber's  Skin:    General: Skin is warm and dry.     Findings: No rash.  Neurological:     Cranial Nerves: Cranial nerves are intact.     Sensory: Sensation is intact.     Motor: Motor function is intact.     Coordination: Coordination is intact.     Comments: Antalgic gait.  Psychiatric:        Speech: Speech normal.        Behavior: Behavior normal.        Thought Content: Thought content normal.           Assessment & Plan:

## 2018-10-21 NOTE — Patient Instructions (Signed)
Treat with prednisone taper. Heat, massage and start home PT.  can use tramadol for breakthrough pain or muscle relaxant as needed for spasm. Call if not improving in 2 weeks.

## 2018-10-27 NOTE — Telephone Encounter (Signed)
Pt had 10/21/18 office visit.

## 2018-11-11 ENCOUNTER — Other Ambulatory Visit: Payer: Self-pay | Admitting: Family Medicine

## 2018-11-13 ENCOUNTER — Other Ambulatory Visit: Payer: Self-pay | Admitting: Family Medicine

## 2018-11-14 ENCOUNTER — Other Ambulatory Visit: Payer: Self-pay

## 2018-11-14 ENCOUNTER — Encounter: Payer: Self-pay | Admitting: Family Medicine

## 2018-11-14 NOTE — Telephone Encounter (Signed)
Pt left v/m wanting cb to explain why wellbutrin refill was denied recently. Pt has been taking med for along time; pt gets sick if stops med and pt has 5 days of med left.

## 2018-11-14 NOTE — Telephone Encounter (Signed)
Per chart Rx was sent to the pharmacy

## 2018-11-15 NOTE — Telephone Encounter (Signed)
[name] pt, last filled 07/06/2018 #45 with 1 refill... last OV with [name] 04/2018 no upcoming appts... please advise

## 2018-11-17 ENCOUNTER — Other Ambulatory Visit: Payer: Self-pay | Admitting: Family Medicine

## 2018-11-18 MED ORDER — BUPROPION HCL ER (XL) 150 MG PO TB24: ORAL_TABLET | ORAL | 0 refills | Status: DC

## 2018-11-18 NOTE — Telephone Encounter (Signed)
[place] [facility] - Client Nonclinical Telephone Record AccessNurse Client [name] - Client Client Site [facility] Physician Renford Dills - MD Contact Type Call Who Is Calling Patient / Member / Family / Caregiver Caller Name Ashland Wiseman Caller Phone Number [phone] Patient Name Thomas Sloan Patient DOB 1965/05/25 Call Type Message Only Information Provided Reason for Call Request for General Office Information Initial Comment Caller states that prescriptions called in are not at the pharmacy. Additional Comment States has enough of the medications to last him until tomorrow. Office hours provided. States will call in the morning during regular business hours to discuss getting his medication. Call Closed By: Marinus Maw Transaction Date/Time: 11/17/2018 6:52:14 PM (ET)

## 2018-11-18 NOTE — Telephone Encounter (Signed)
Spoke with patient and pharmacy. Prescription was received by the pharmacy and is ready for pick up. Also discussed with patient that he would be due for follow up or physical in the fall-by December. He will call us back, encouraged to call ahead of time before he runs out of his medication. Patient verbalized understanding and appreciated the call

## 2018-11-18 NOTE — Telephone Encounter (Signed)
Pt calling again regarding refills.  Pt is requesting that we contact [facility] (preferred pharmacy) - pharmacy still has not received the Rx.

## 2019-01-18 ENCOUNTER — Encounter: Payer: Self-pay | Admitting: Family Medicine

## 2019-01-18 LAB — LIPID PANEL
Cholesterol: 169 (ref 0–200)
HDL: 39 (ref 35–70)
LDL Cholesterol: 114
Triglycerides: 85 (ref 40–160)

## 2019-01-18 LAB — BASIC METABOLIC PANEL
Creatinine: 1.2 (ref 0.6–1.3)
Glucose: 89

## 2019-01-18 LAB — CBC AND DIFFERENTIAL: Hemoglobin: 14.9 (ref 13.5–17.5)

## 2019-01-18 LAB — HEPATIC FUNCTION PANEL
ALT: 20 (ref 10–40)
AST: 28 (ref 14–40)

## 2019-02-03 ENCOUNTER — Ambulatory Visit (INDEPENDENT_AMBULATORY_CARE_PROVIDER_SITE_OTHER): Payer: BC Managed Care – PPO | Admitting: Family Medicine

## 2019-02-03 ENCOUNTER — Encounter: Payer: Self-pay | Admitting: Family Medicine

## 2019-02-03 ENCOUNTER — Other Ambulatory Visit: Payer: Self-pay

## 2019-02-03 VITALS — BP 104/76 | HR 77 | Temp 97.7°F | Ht 71.0 in | Wt 240.2 lb

## 2019-02-03 DIAGNOSIS — F3342 Major depressive disorder, recurrent, in full remission: Secondary | ICD-10-CM

## 2019-02-03 DIAGNOSIS — E663 Overweight: Secondary | ICD-10-CM

## 2019-02-03 DIAGNOSIS — Z7189 Other specified counseling: Secondary | ICD-10-CM

## 2019-02-03 DIAGNOSIS — Z0000 Encounter for general adult medical examination without abnormal findings: Secondary | ICD-10-CM | POA: Diagnosis not present

## 2019-02-03 MED ORDER — NALTREXONE HCL 50 MG PO TABS: ORAL_TABLET | ORAL | 3 refills | Status: DC

## 2019-02-03 MED ORDER — FINASTERIDE 5 MG PO TABS: ORAL_TABLET | ORAL | 3 refills | Status: DC

## 2019-02-03 MED ORDER — OMEPRAZOLE 20 MG PO CPDR: 20.0000 mg | DELAYED_RELEASE_CAPSULE | Freq: Every day | ORAL | 3 refills | Status: DC

## 2019-02-03 MED ORDER — BUPROPION HCL ER (XL) 150 MG PO TB24: ORAL_TABLET | ORAL | 3 refills | Status: DC

## 2019-02-03 NOTE — Progress Notes (Signed)
CPE- See plan.  Routine anticipatory guidance given to patient.  See health maintenance.  The possibility exists that previously documented standard health maintenance information may have been brought forward from a previous encounter into this note.  If needed, that same information has been updated to reflect the current situation based on today's encounter.    Tetanus 2013 Flu 2020 PNA and shingles not due, d/w pt Colonoscopy 2015.  He'll call about follow up.  PSA wnl < 1 year ago.   Living will d/w pt. If incapacitated, would have wife designated.  Diet and exercise d/w pt. Recent labs discussed with patient.  Mood d/w pt.  No ADE on med.  Pandemic considerations d/w pt, as that would be expected to affect any person's mood.  No SI/HI.    Weight loss d/w pt.  Able to tolerate naltrexone, it helped.  D/w pt about use with wellbutrin.  He may need to change the timing of his dose to help with PM snacking.    PMH and SH reviewed  Meds, vitals, and allergies reviewed.   ROS: Per HPI.  Unless specifically indicated otherwise in HPI, the patient denies:  General: fever. Eyes: acute vision changes ENT: sore throat Cardiovascular: chest pain Respiratory: SOB GI: vomiting GU: dysuria Musculoskeletal: acute back pain Derm: acute rash Neuro: acute motor dysfunction Psych: worsening mood Endocrine: polydipsia Heme: bleeding Allergy: hayfever  GEN: nad, alert and oriented HEENT: ncat NECK: supple w/o LA CV: rrr. PULM: ctab, no inc wob ABD: soft, +bs EXT: no edema SKIN: no acute rash

## 2019-02-03 NOTE — Patient Instructions (Signed)
Thanks for getting a flu shot.  Don't change your meds for now.  Cal about follow up with GI.  Take care.  Glad to see you.

## 2019-02-05 NOTE — Assessment & Plan Note (Signed)
Able to tolerate naltrexone, it helped.  D/w pt about use with wellbutrin.  He may need to change the timing of his dose to help with PM snacking.  He will update me as needed.

## 2019-02-05 NOTE — Assessment & Plan Note (Signed)
No ADE on med.  Pandemic considerations d/w pt, as that would be expected to affect any person's mood.  No SI/HI.  Would continue current medications.  Mood is reasonable.  He will update me as needed.

## 2019-02-05 NOTE — Assessment & Plan Note (Signed)
Living will d/w pt. If incapacitated, would have wife designated.  

## 2019-02-05 NOTE — Assessment & Plan Note (Signed)
  Tetanus 2013 Flu 2020 PNA and shingles not due, d/w pt Colonoscopy 2015.  He'll call about follow up.  PSA wnl < 1 year ago.   Living will d/w pt. If incapacitated, would have wife designated.  Diet and exercise d/w pt. Recent labs discussed with patient.

## 2019-04-01 ENCOUNTER — Other Ambulatory Visit: Payer: Self-pay

## 2019-04-01 DIAGNOSIS — Z20822 Contact with and (suspected) exposure to covid-19: Secondary | ICD-10-CM

## 2019-04-03 LAB — NOVEL CORONAVIRUS, NAA: SARS-CoV-2, NAA: NOT DETECTED

## 2019-04-17 LAB — HM COLONOSCOPY

## 2019-04-26 ENCOUNTER — Encounter: Payer: Self-pay | Admitting: Family Medicine

## 2019-05-01 ENCOUNTER — Ambulatory Visit (INDEPENDENT_AMBULATORY_CARE_PROVIDER_SITE_OTHER): Payer: BC Managed Care – PPO | Admitting: Family Medicine

## 2019-05-01 DIAGNOSIS — N5319 Other ejaculatory dysfunction: Secondary | ICD-10-CM | POA: Insufficient documentation

## 2019-05-01 MED ORDER — FINASTERIDE 5 MG PO TABS: ORAL_TABLET | ORAL | Status: DC

## 2019-05-01 NOTE — Assessment & Plan Note (Signed)
Ejaculatory change, dec volume, no pain or blood.  Reasonable to check PSA at next CPE.  Prev PSA wnl.   Stop finasteride for now.  Could be related to finasteride but it may be an incidental issue.  He'll update me as needed.  He agrees.

## 2019-05-01 NOTE — Progress Notes (Signed)
Virtual visit completed through WebEx or similar program Patient location: home  Provider location: Financial controller at [facility], office   Pandemic considerations d/w pt.   Limitations and rationale for visit method d/w patient.  Patient agreed to proceed.   CC: follow up, med concern.   HPI: He has been off finasteride for about 2 weeks. His libido is wnl but his ejaculate is abnormal- lower volume.  Not passing blood in urine, semen or stool. No FCNAVD. He feels well o/w.  Erectile function is still wnl.  No pain, no testicle pain.    He had recent colonoscopy done.  He was told 5 year f/u.    Meds and allergies reviewed.   ROS: Per HPI unless specifically indicated in ROS section   NAD Speech wnl  A/P: Ejaculatory change, dec volume, no pain or blood.  Reasonable to check PSA at next CPE.  Prev PSA wnl.   Stop finasteride for now.  Could be related to finasteride but it may be an incidental issue.  He'll update me as needed.  He agrees.

## 2019-05-09 ENCOUNTER — Encounter: Payer: Self-pay | Admitting: Family Medicine

## 2019-05-24 ENCOUNTER — Telehealth: Payer: Self-pay

## 2019-05-24 NOTE — Telephone Encounter (Signed)
Spoke with patient. COVID was negative. Patient is feeling better and started to feeling better that same evening. Advised patient to let us know if he needs anything else in the future.

## 2019-05-24 NOTE — Telephone Encounter (Signed)
Dr. [name] received notes from [facility] on patient. Dr [name] wanted to see how patient is doing.  I called and left a message for patient to call back with update.

## 2019-05-24 NOTE — Telephone Encounter (Signed)
Noted.  Thanks.  Glad to hear.  ? ?

## 2019-06-28 ENCOUNTER — Ambulatory Visit: Payer: BC Managed Care – PPO

## 2019-07-10 ENCOUNTER — Encounter: Payer: Self-pay | Admitting: Family Medicine

## 2019-07-10 NOTE — Telephone Encounter (Signed)
Covid vaccines recorded under Immunizations.  The remainder of the message forwarded to Dr. [name].

## 2019-07-11 ENCOUNTER — Encounter: Payer: Self-pay | Admitting: Family Medicine

## 2019-07-11 ENCOUNTER — Encounter: Payer: Self-pay | Admitting: *Deleted

## 2019-09-13 ENCOUNTER — Other Ambulatory Visit: Payer: Self-pay

## 2019-09-13 ENCOUNTER — Emergency Department (HOSPITAL_COMMUNITY)
Admission: EM | Admit: 2019-09-13 | Discharge: 2019-09-13 | Disposition: A | Discharge status: Home / Self Care | Payer: BC Managed Care – PPO | Attending: Emergency Medicine | Admitting: Emergency Medicine

## 2019-09-13 ENCOUNTER — Encounter (HOSPITAL_COMMUNITY): Payer: Self-pay | Admitting: Emergency Medicine

## 2019-09-13 DIAGNOSIS — Z79899 Other long term (current) drug therapy: Secondary | ICD-10-CM | POA: Diagnosis not present

## 2019-09-13 DIAGNOSIS — H9201 Otalgia, right ear: Secondary | ICD-10-CM | POA: Insufficient documentation

## 2019-09-13 DIAGNOSIS — Z87891 Personal history of nicotine dependence: Secondary | ICD-10-CM | POA: Insufficient documentation

## 2019-09-13 DIAGNOSIS — Z711 Person with feared health complaint in whom no diagnosis is made: Secondary | ICD-10-CM

## 2019-09-13 NOTE — ED Triage Notes (Signed)
Patient was digging in his right ear with a qtip. Patient got the cotton stuck in his ear.

## 2019-09-13 NOTE — Discharge Instructions (Addendum)
Please do not put anything into your ear.  You may use a q-tip to clean the outside of the ear but do not put anything into the ear canal.    You may use debrox drops from the drug store if needed to remove any wax.

## 2019-09-13 NOTE — ED Provider Notes (Signed)
[facility] DEPT Provider Note   CSN: [id number] Arrival date & time: 09/13/19  2142     History Chief Complaint  Patient presents with  . Ear Problem    Thomas Sloan is a 54 y.o. male with a past medical history of depression, who presents today for concern of right ear foreign body.  He reports that about 3 hours ago he got out of the shower and was using cotton swabs to clean his ears.  When he took the cotton swab out of his right ear there was no cotton on the swab and he is concerned he has cotton stuck in his ear.  He denies any changes to his hearing.  No drainage from the ear.    HPI     Past Medical History:  Diagnosis Date  . Alcohol abuse    Sober since 2004.  Still in meetings.   . Allergic rhinitis   . Colon polyps   . Depression   . GERD (gastroesophageal reflux disease)   . Hair loss    with finasteride per Dr. Tonia Brooms  . Melanoma ([place])    hx of on right leg, s/p resection  . Stress fracture    h/o; L tibia    Patient Active Problem List   Diagnosis Date Noted  . Abnormal ejaculation 05/01/2019  . Acute low back pain with right-sided sciatica 10/21/2018  . FH: prostate cancer 04/21/2017  . Health care maintenance 04/21/2017  . Advance care planning 03/13/2016  . Overweight 06/20/2013  . Hair loss 02/24/2013  . Routine general medical examination at a health care facility 10/27/2010  . Onychomycosis 10/27/2010  . Major depression 03/24/2010  . ALLERGIC RHINITIS 03/24/2010  . GERD 03/24/2010  . SKIN CANCER, HX OF 03/24/2010  . COLONIC POLYPS, HX OF 03/24/2010    Past Surgical History:  Procedure Laterality Date  . MELANOMA EXCISION  around 2003   Stage 1 on leg, removed by Dr. Tonia Brooms  . TONSILLECTOMY AND ADENOIDECTOMY     around 68       Family History  Problem Relation Age of Onset  . Hypertension Mother   . Stroke Mother        TIA  . Diabetes Mother   . Head & neck cancer Mother   . Cancer Father         history of colon cancer  . Colon cancer Father   . Prostate cancer Father   . Diabetes Maternal Grandmother   . COPD Maternal Grandfather   . Arthritis Paternal Grandmother   . Heart disease Paternal Grandfather     Social History   Tobacco Use  . Smoking status: Former Research scientist (life sciences)  . Smokeless tobacco: Never Used  . Tobacco comment: quit 1993  Substance Use Topics  . Alcohol use: No    Alcohol/week: 0.0 standard drinks    Comment: Sober since 2004.  Still in meetings.   . Drug use: No    Home Medications Prior to Admission medications   Medication Sig Start Date End Date Taking? Authorizing Provider  buPROPion (WELLBUTRIN XL) 150 MG 24 hr tablet TAKE 3 TABLETS(450 MG) BY MOUTH DAILY 02/03/19   Tonia Ghent, MD  finasteride (PROSCAR) 5 MG tablet Held as of 05/01/19. 05/01/19   Tonia Ghent, MD  metoprolol tartrate (LOPRESSOR) 25 MG tablet Take 0.5-1 tablets (12.5-25 mg total) by mouth 2 (two) times daily as needed (For public speaking). Patient not taking: Reported on 05/01/2019 06/22/16   [name],  Elveria Rising, MD  naltrexone (DEPADE) 50 MG tablet TAKE 1/2 TABLET(25 MG) BY MOUTH DAILY 02/03/19   Tonia Ghent, MD  omeprazole (PRILOSEC) 20 MG capsule Take 1 capsule (20 mg total) by mouth daily. 02/03/19   Tonia Ghent, MD  omeprazole (PRILOSEC OTC) 20 MG tablet Take 1 tablet (20 mg total) by mouth daily. 03/04/11 03/12/12  Tonia Ghent, MD    Allergies    Patient has no known allergies.  Review of Systems   Review of Systems  Constitutional: Negative for chills and fever.  HENT: Negative for ear discharge, ear pain and hearing loss.        Concern for ear FB  All other systems reviewed and are negative.   Physical Exam Updated Vital Signs BP 121/89 (BP Location: Left Arm)   Pulse 81   Temp 98.1 F (36.7 C) (Oral)   Resp 16   Ht 5\' 11"  (1.803 m)   Wt 108.9 kg   SpO2 97%   BMI 33.47 kg/m   Physical Exam Vitals and nursing note reviewed.   Constitutional:      General: He is not in acute distress. HENT:     Head: Normocephalic and atraumatic.     Right Ear: No mastoid tenderness.     Left Ear: No mastoid tenderness.     Ears:     Comments: There is no evidence of foreign body in the right or left ear.  Bilaterally I am able to view the entire TM.  Bilateral TMs are pearly gray with visualized bony landmarks and no shifting of the cone of light.  There is no significant wax buildup bilaterally.  No drainage from bilateral ears.  NO perforation, hemotympanum, or mid ear effusion bilaterally.  Skin of entire right ear canal was visualized from opening to the TM.  Cardiovascular:     Rate and Rhythm: Normal rate.  Skin:    General: Skin is warm and dry.  Neurological:     Mental Status: He is alert.     Comments: Hearing grossly intact bilaterally  Psychiatric:        Mood and Affect: Mood is anxious.     ED Results / Procedures / Treatments   Labs (all labs ordered are listed, but only abnormal results are displayed) Labs Reviewed - No data to display  EKG None  Radiology No results found.  Procedures Procedures (including critical care time)  Medications Ordered in ED Medications - No data to display  ED Course  I have reviewed the triage vital signs and the nursing notes.  Pertinent labs & imaging results that were available during my care of the patient were reviewed by me and considered in my medical decision making (see chart for details).    MDM Rules/Calculators/A&P                     Patient presents today for concern of foreign body in the right ear after he was using a cotton swab to clean his ear and when he removed the swab there was no cotton on the end.  I thoroughly inspected the bilateral ears and there is no evidence of foreign body.  Bilaterally I am able to see the entirety of both TMs without foreign body significant wax buildup or other abnormalities.    Return precautions were  discussed with patient who states their understanding.  At the time of discharge patient denied any unaddressed complaints or concerns.  Patient  is agreeable for discharge home.  Note: Portions of this report may have been transcribed using voice recognition software. Every effort was made to ensure accuracy; however, inadvertent computerized transcription errors may be present  Final Clinical Impression(s) / ED Diagnoses Final diagnoses:  Concern about ear disease without diagnosis    Rx / DC Orders ED Discharge Orders    None       Lorin Glass, PA-C 09/13/19 2256    Tegeler, Gwenyth Allegra, MD 09/14/19 [phone]

## 2019-12-24 ENCOUNTER — Encounter: Payer: Self-pay | Admitting: Family Medicine

## 2019-12-31 ENCOUNTER — Other Ambulatory Visit: Payer: Self-pay | Admitting: Family Medicine

## 2019-12-31 DIAGNOSIS — R5383 Other fatigue: Secondary | ICD-10-CM

## 2019-12-31 DIAGNOSIS — Z823 Family history of stroke: Secondary | ICD-10-CM

## 2019-12-31 DIAGNOSIS — Z125 Encounter for screening for malignant neoplasm of prostate: Secondary | ICD-10-CM

## 2020-01-05 ENCOUNTER — Other Ambulatory Visit (INDEPENDENT_AMBULATORY_CARE_PROVIDER_SITE_OTHER): Payer: BC Managed Care – PPO

## 2020-01-05 ENCOUNTER — Other Ambulatory Visit: Payer: Self-pay

## 2020-01-05 DIAGNOSIS — R5383 Other fatigue: Secondary | ICD-10-CM | POA: Diagnosis not present

## 2020-01-05 DIAGNOSIS — Z823 Family history of stroke: Secondary | ICD-10-CM

## 2020-01-05 DIAGNOSIS — Z125 Encounter for screening for malignant neoplasm of prostate: Secondary | ICD-10-CM

## 2020-01-05 LAB — CBC WITH DIFFERENTIAL/PLATELET
Basophils Absolute: 0 10*3/uL (ref 0.0–0.1)
Basophils Relative: 0.8 % (ref 0.0–3.0)
Eosinophils Absolute: 0.2 10*3/uL (ref 0.0–0.7)
Eosinophils Relative: 4.1 % (ref 0.0–5.0)
HCT: 42.6 % (ref 39.0–52.0)
Hemoglobin: 14.6 g/dL (ref 13.0–17.0)
Lymphocytes Relative: 34.4 % (ref 12.0–46.0)
Lymphs Abs: 1.9 10*3/uL (ref 0.7–4.0)
MCHC: 34.2 g/dL (ref 30.0–36.0)
MCV: 94.2 fl (ref 78.0–100.0)
Monocytes Absolute: 0.5 10*3/uL (ref 0.1–1.0)
Monocytes Relative: 9.3 % (ref 3.0–12.0)
Neutro Abs: 2.8 10*3/uL (ref 1.4–7.7)
Neutrophils Relative %: 51.4 % (ref 43.0–77.0)
Platelets: 208 10*3/uL (ref 150.0–400.0)
RBC: 4.52 Mil/uL (ref 4.22–5.81)
RDW: 13.2 % (ref 11.5–15.5)
WBC: 5.4 10*3/uL (ref 4.0–10.5)

## 2020-01-05 LAB — LIPID PANEL
Cholesterol: 166 mg/dL (ref 0–200)
HDL: 42.9 mg/dL (ref >39.00)
LDL Cholesterol: 96 mg/dL (ref 0–99)
NonHDL: 123.53
Total CHOL/HDL Ratio: 4
Triglycerides: 140 mg/dL (ref 0.0–149.0)
VLDL: 28 mg/dL (ref 0.0–40.0)

## 2020-01-05 LAB — COMPREHENSIVE METABOLIC PANEL
ALT: 17 U/L (ref 0–53)
AST: 19 U/L (ref 0–37)
Albumin: 4.4 g/dL (ref 3.5–5.2)
Alkaline Phosphatase: 57 U/L (ref 39–117)
BUN: 18 mg/dL (ref 6–23)
CO2: 32 mEq/L (ref 19–32)
Calcium: 9.7 mg/dL (ref 8.4–10.5)
Chloride: 102 mEq/L (ref 96–112)
Creatinine, Ser: 1.37 mg/dL (ref 0.40–1.50)
GFR: 54.35 mL/min — ABNORMAL LOW (ref >60.00)
Glucose, Bld: 101 mg/dL — ABNORMAL HIGH (ref 70–99)
Potassium: 4.6 mEq/L (ref 3.5–5.1)
Sodium: 140 mEq/L (ref 135–145)
Total Bilirubin: 0.7 mg/dL (ref 0.2–1.2)
Total Protein: 6.4 g/dL (ref 6.0–8.3)

## 2020-01-05 LAB — TSH: TSH: 1.89 u[IU]/mL (ref 0.35–4.50)

## 2020-01-05 LAB — TESTOSTERONE: Testosterone: 373.42 ng/dL (ref 300.00–890.00)

## 2020-01-05 LAB — PSA: PSA: 0.55 ng/mL (ref 0.10–4.00)

## 2020-01-10 ENCOUNTER — Other Ambulatory Visit: Payer: Self-pay | Admitting: Family Medicine

## 2020-01-10 DIAGNOSIS — E291 Testicular hypofunction: Secondary | ICD-10-CM

## 2020-01-10 DIAGNOSIS — R6882 Decreased libido: Secondary | ICD-10-CM

## 2020-01-11 ENCOUNTER — Telehealth: Payer: Self-pay | Admitting: Family Medicine

## 2020-01-11 NOTE — Telephone Encounter (Signed)
I apologize.  Let me know if I need to put in a new referral to [facility].  Thanks.

## 2020-01-11 NOTE — Telephone Encounter (Signed)
Patient called in stating a referral was placed for him at [facility] urology, however he would like one in [place] as this is where he lives. Please advise.

## 2020-02-08 ENCOUNTER — Other Ambulatory Visit: Payer: Self-pay | Admitting: Family Medicine

## 2020-02-08 MED ORDER — OMEPRAZOLE 20 MG PO CPDR: 20.0000 mg | DELAYED_RELEASE_CAPSULE | Freq: Every day | ORAL | 0 refills | Status: DC

## 2020-02-08 NOTE — Telephone Encounter (Signed)
Electronic refill request. Naltrexone Last office visit:   05/01/2019 Last Filled:     45 tablet 3 02/03/2019

## 2020-02-09 NOTE — Telephone Encounter (Signed)
Sent. Thanks.   

## 2020-02-18 ENCOUNTER — Encounter: Payer: Self-pay | Admitting: Family Medicine

## 2020-02-22 LAB — BASIC METABOLIC PANEL
Creatinine: 1.3 (ref 0.6–1.3)
Glucose: 101

## 2020-02-22 LAB — LIPID PANEL: LDL Cholesterol: 21

## 2020-02-22 LAB — HEPATIC FUNCTION PANEL
ALT: 21 (ref 10–40)
AST: 23 (ref 14–40)

## 2020-02-22 LAB — CBC AND DIFFERENTIAL: Hemoglobin: 14.7 (ref 13.5–17.5)

## 2020-02-23 LAB — LIPID PANEL
Cholesterol: 186 (ref 0–200)
HDL: 43 (ref 35–70)
LDL Cholesterol: 122
Triglycerides: 118 (ref 40–160)

## 2020-03-15 ENCOUNTER — Encounter: Payer: Self-pay | Admitting: Family Medicine

## 2020-03-15 ENCOUNTER — Other Ambulatory Visit: Payer: Self-pay

## 2020-03-15 ENCOUNTER — Ambulatory Visit: Payer: BC Managed Care – PPO | Admitting: Family Medicine

## 2020-03-15 VITALS — BP 124/82 | HR 80 | Temp 97.1°F | Ht 71.0 in | Wt 253.4 lb

## 2020-03-15 DIAGNOSIS — E663 Overweight: Secondary | ICD-10-CM

## 2020-03-15 DIAGNOSIS — K219 Gastro-esophageal reflux disease without esophagitis: Secondary | ICD-10-CM

## 2020-03-15 DIAGNOSIS — Z0000 Encounter for general adult medical examination without abnormal findings: Secondary | ICD-10-CM | POA: Diagnosis not present

## 2020-03-15 DIAGNOSIS — R0683 Snoring: Secondary | ICD-10-CM

## 2020-03-15 DIAGNOSIS — Z7189 Other specified counseling: Secondary | ICD-10-CM

## 2020-03-15 DIAGNOSIS — F3342 Major depressive disorder, recurrent, in full remission: Secondary | ICD-10-CM

## 2020-03-15 MED ORDER — CLOMIPHENE CITRATE 50 MG PO TABS
25.0000 mg | ORAL_TABLET | Freq: Every day | ORAL | Status: DC
Start: 2020-03-15 — End: 2021-01-28

## 2020-03-15 MED ORDER — BUPROPION HCL ER (XL) 150 MG PO TB24: ORAL_TABLET | ORAL | 3 refills | Status: DC

## 2020-03-15 MED ORDER — OMEPRAZOLE 20 MG PO CPDR: 20.0000 mg | DELAYED_RELEASE_CAPSULE | Freq: Every day | ORAL | 3 refills | Status: DC

## 2020-03-15 NOTE — Progress Notes (Signed)
This visit occurred during the SARS-CoV-2 public health emergency.  Safety protocols were in place, including screening questions prior to the visit, additional usage of staff PPE, and extensive cleaning of exam room while observing appropriate contact time as indicated for disinfecting solutions.  CPE- See plan.  Routine anticipatory guidance given to patient.  See health maintenance.  The possibility exists that previously documented standard health maintenance information may have been brought forward from a previous encounter into this note.  If needed, that same information has been updated to reflect the current situation based on today's encounter.    Tetanus 2013 Flu 2021 PNA and shingles d/w pt covid vaccine 2021 Colonoscopy 2020 PSA 2021  he is off finasteride as of 2021 Living will d/w pt. If incapacitated, would have wife designated.  Diet and exercise d/w pt. Recent labs discussed with patient.  He had hepatitis B vaccine series 2021.  Mood d/w pt.  No ADE on med.  Pandemic considerations d/w pt, as that would be expected to affect any person's mood.  No SI/HI.  He is hopeful that clomid treatment will help.  He is going to follow up with counseling- he is trying to get that set up in person instead of via video.    Weight d/w pt.  Able to tolerate naltrexone, it helped prev.  D/w pt about use with wellbutrin.  he has regained some weight in the meantime.  We talked about a period of time off naltrexone.    On clomid per urology.  He has only been on med for about 2 weeks.  It may be helping some but he hasn't likely had time for full effect.    GERD.  Noted with inc in weight.  Some nocturnal sx, esp after eating later in the night.  Talking alka seltzer at night vs pepcid prn.  Weight loss would likely help per patient report- has helped in the past, d/w pt.    Fatigue d/w pt.  Snoring.  No known apnea.  No AM HA.  No red flag events due to fatigue.  Sleeping on wedge pillow.   We talked about elevating the head of his bed.  Refer for OSA testing.   He has routine derm f/u.  Dr. Renda Rolls.    Still going to meetings with AA.    PMH and SH reviewed  Meds, vitals, and allergies reviewed.   ROS: Per HPI.  Unless specifically indicated otherwise in HPI, the patient denies:  General: fever. Eyes: acute vision changes ENT: sore throat Cardiovascular: chest pain Respiratory: SOB GI: vomiting GU: dysuria Musculoskeletal: acute back pain Derm: acute rash Neuro: acute motor dysfunction Psych: worsening mood Endocrine: polydipsia Heme: bleeding Allergy: hayfever  GEN: nad, alert and oriented HEENT: ncat NECK: supple w/o LA, 18" neck. CV: rrr. PULM: ctab, no inc wob ABD: soft, +bs EXT: no edema SKIN: no acute rash

## 2020-03-15 NOTE — Patient Instructions (Addendum)
Check with your insurance to see if they will cover the shingrix shot. Stop the naltrexone and see how you do.   We'll call about seeing pulmonary.  Keep working on diet and exercise.   Keep taking prilosec, up to twice a day if needed.   Take care.  Glad to see you.

## 2020-03-17 NOTE — Assessment & Plan Note (Signed)
Tetanus 2013 Flu 2021 PNA and shingles d/w pt covid vaccine 2021 Colonoscopy 2020 PSA 2021  he is off finasteride as of 2021 Living will d/w pt. If incapacitated, would have wife designated.  Diet and exercise d/w pt. Recent labs discussed with patient.

## 2020-03-18 DIAGNOSIS — G4733 Obstructive sleep apnea (adult) (pediatric): Secondary | ICD-10-CM | POA: Insufficient documentation

## 2020-03-18 DIAGNOSIS — R0683 Snoring: Secondary | ICD-10-CM | POA: Insufficient documentation

## 2020-03-18 NOTE — Assessment & Plan Note (Signed)
Weight d/w pt.  Able to tolerate naltrexone, it helped prev.  D/w pt about use with wellbutrin.  he has regained some weight in the meantime.  We talked about a period of time off naltrexone.   He will stop the medication and see how he does in the meantime.

## 2020-03-18 NOTE — Assessment & Plan Note (Signed)
Mood d/w pt.  No ADE on med.  Pandemic considerations d/w pt, as that would be expected to affect any person's mood.  No SI/HI.  He is hopeful that clomid treatment will help.  He is going to follow up with counseling- he is trying to get that set up in person instead of via video.

## 2020-03-18 NOTE — Assessment & Plan Note (Signed)
Noted with inc in weight.  Some nocturnal sx, esp after eating later in the night.  Talking alka seltzer at night vs pepcid prn.  Weight loss would likely help per patient report- has helped in the past, d/w pt. no change in meds at this point but he will continue work on weight loss.

## 2020-03-18 NOTE — Assessment & Plan Note (Signed)
Living will d/w pt. If incapacitated, would have wife designated.  

## 2020-03-18 NOTE — Assessment & Plan Note (Signed)
Fatigue d/w pt.  Snoring.  No known apnea.  No AM HA.  No red flag events due to fatigue.  Sleeping on wedge pillow.  We talked about elevating the head of his bed.  Refer for OSA testing.

## 2020-03-28 ENCOUNTER — Encounter: Payer: Self-pay | Admitting: Family Medicine

## 2020-04-13 ENCOUNTER — Encounter: Payer: Self-pay | Admitting: Family Medicine

## 2020-04-16 ENCOUNTER — Institutional Professional Consult (permissible substitution): Payer: BC Managed Care – PPO | Admitting: Pulmonary Disease

## 2020-06-17 NOTE — Progress Notes (Signed)
06/18/20- 56 yoM former smoker for sleep evaluation courtesy of Elsie Stain, MD with concern of snoring. Medical problem list includes Allergic Rhinitis, GERD, Low Back Pain w Sciatica, Hx ETOH, Major Depression, hx Melanoma, Overweight Epworth score-5 Body weight today- Covid vax-3 Moderna Flu vax-had Wife tells him snores. She has just started CPAP herself. 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 Denies drowsy driving.  May nap occasionally if siting quietly at home.  Denies sleep meds  3 cups coffee in AM. Occasionally up late watching TV.  Denies ENT except adenoids. Denies heart or lung problems. No complex parasomnias Head of bed is up and takes acid blocker but still notes acid reflux.Advised to discuss w GI.  Prior to Admission medications   Medication Sig Start Date End Date Taking? Authorizing Provider  buPROPion (WELLBUTRIN XL) 150 MG 24 hr tablet TAKE 3 TABLETS(450 MG) BY MOUTH DAILY 03/15/20  Yes Tonia Ghent, MD  clomiPHENE (CLOMID) 50 MG tablet Take 0.5 tablets (25 mg total) by mouth daily. 03/15/20  Yes Tonia Ghent, MD  metoprolol tartrate (LOPRESSOR) 25 MG tablet Take 0.5-1 tablets (12.5-25 mg total) by mouth 2 (two) times daily as needed (For public speaking). 06/22/16  Yes Tonia Ghent, MD  omeprazole (PRILOSEC) 20 MG capsule Take 1-2 capsules (20-40 mg total) by mouth daily. 03/15/20  Yes Tonia Ghent, MD  triamcinolone cream (KENALOG) 0.1 % Apply topically as needed. 01/10/20  Yes [provider]  omeprazole (PRILOSEC OTC) 20 MG tablet Take 1 tablet (20 mg total) by mouth daily. 03/04/11 03/15/20  Tonia Ghent, MD   Past Medical History:  Diagnosis Date  . Alcohol abuse    Sober since 2004.  Still in meetings.   . Allergic rhinitis   . Colon polyps   . Depression   . GERD (gastroesophageal reflux disease)   . Hair loss    with finasteride per Dr. Tonia Brooms  . Melanoma ([place])    hx of on right leg, s/p  resection  . Stress fracture    h/o; L tibia   Past Surgical History:  Procedure Laterality Date  . MELANOMA EXCISION  around 2003   Stage 1 on leg, removed by Dr. Tonia Brooms  . TONSILLECTOMY AND ADENOIDECTOMY     around 39   Family History  Problem Relation Age of Onset  . Hypertension Mother   . Stroke Mother        TIA  . Diabetes Mother   . Head & neck cancer Mother   . Cancer Father        history of colon cancer  . Colon cancer Father   . Prostate cancer Father   . Diabetes Maternal Grandmother   . COPD Maternal Grandfather   . Arthritis Paternal Grandmother   . Heart disease Paternal Grandfather    Social History   Socioeconomic History  . Marital status: Single    Spouse name: Not on file  . Number of children: Not on file  . Years of education: Not on file  . Highest education level: Not on file  Occupational History  . Occupation: Agricultural consultant    CommentTour manager  Tobacco Use  . Smoking status: Former Smoker    Packs/day: 1.00    Types: Cigarettes    Quit date: 1993    Years since quitting: 29.1  . Smokeless tobacco: Never Used  . Tobacco comment: quit 1993  Vaping Use  . Vaping  Use: Never used  Substance and Sexual Activity  . Alcohol use: No    Alcohol/week: 0.0 standard drinks    Comment: Sober since 2004.  Still in meetings.   . Drug use: No  . Sexual activity: Yes    Birth control/protection: None  Other Topics Concern  . Not on file  Social History Narrative   BS at [employer] with additional study at [employer]   Regular exercise- yes   Visual merchandiser / Agricultural consultant   Married 1996 to [place] Determinants of Health   Financial Resource Strain: Not on file  Food Insecurity: Not on file  Transportation Needs: Not on file  Physical Activity: Not on file  Stress: Not on file  Social Connections: Not on file  Intimate Partner Violence: Not on file   ROS-see HPI   + = positive Constitutional:     weight loss, night sweats, fevers, chills, fatigue, lassitude. HEENT:    headaches, difficulty swallowing, tooth/dental problems, sore throat,       sneezing, itching, ear ache, nasal congestion, post nasal drip, snoring CV:    chest pain, orthopnea, PND, swelling in lower extremities, anasarca,                                   dizziness, palpitations Resp:   shortness of breath with exertion or at rest.                productive cough,   non-productive cough, coughing up of blood.              change in color of mucus.  wheezing.   Skin:    rash or lesions. GI:  No-   heartburn, indigestion, abdominal pain, nausea, vomiting, diarrhea,                 change in bowel habits, loss of appetite GU: dysuria, change in color of urine, no urgency or frequency.   flank pain. MS:   joint pain, stiffness, decreased range of motion, back pain. Neuro-     nothing unusual Psych:  change in mood or affect.  depression or anxiety.   memory loss.  OBJ- Physical Exam General- Alert, Oriented, Affect-appropriate, Distress- none acute Skin- rash-none, lesions- none, excoriation- none Lymphadenopathy- none Head- atraumatic            Eyes- Gross vision intact, PERRLA, conjunctivae and secretions clear            Ears- Hearing, canals-normal            Nose- Clear, no-Septal dev, mucus, polyps, erosion, perforation             Throat- Mallampati II-III , mucosa clear , drainage- none, tonsils- atrophic, + teeth Neck- flexible , trachea midline, no stridor , thyroid nl, carotid no bruit Chest - symmetrical excursion , unlabored           Heart/CV- RRR , no murmur , no gallop  , no rub, nl s1 s2                           - JVD- none , edema- none, stasis changes- none, varices- none           Lung- clear to P&A, wheeze- none, cough- none , dullness-none, rub- none           Chest  wall-  Abd-  Br/ Gen/ Rectal- Not done, not indicated Extrem- cyanosis- none, clubbing, none, atrophy- none, strength-  nl Neuro- grossly intact to observation

## 2020-06-18 ENCOUNTER — Ambulatory Visit (INDEPENDENT_AMBULATORY_CARE_PROVIDER_SITE_OTHER): Payer: BC Managed Care – PPO | Admitting: Internal Medicine

## 2020-06-18 ENCOUNTER — Other Ambulatory Visit: Payer: Self-pay

## 2020-06-18 ENCOUNTER — Encounter: Payer: Self-pay | Admitting: Internal Medicine

## 2020-06-18 VITALS — BP 122/80 | HR 88 | Temp 97.5°F | Ht 71.0 in | Wt 253.6 lb

## 2020-06-18 DIAGNOSIS — R0683 Snoring: Secondary | ICD-10-CM | POA: Diagnosis not present

## 2020-06-18 DIAGNOSIS — K219 Gastro-esophageal reflux disease without esophagitis: Secondary | ICD-10-CM | POA: Diagnosis not present

## 2020-06-18 NOTE — Patient Instructions (Signed)
Order- schedule home sleep test   Dx snoring  Please call us for results and recommendations about 2 weeks after your sleep test.

## 2020-06-18 NOTE — Assessment & Plan Note (Signed)
He describes active reflux despite current interventions. Plan- strongly advised to f/u w GI

## 2020-06-18 NOTE — Assessment & Plan Note (Signed)
Wife is now on CPAP and sensitized to issue with husband. Appropriate discussion including, testing, safe driving, treatment options for OSA. Plan- sleep study

## 2020-07-05 ENCOUNTER — Other Ambulatory Visit: Payer: Self-pay

## 2020-07-05 ENCOUNTER — Ambulatory Visit: Payer: Self-pay

## 2020-07-05 DIAGNOSIS — R0683 Snoring: Secondary | ICD-10-CM

## 2020-07-05 DIAGNOSIS — G4733 Obstructive sleep apnea (adult) (pediatric): Secondary | ICD-10-CM | POA: Diagnosis not present

## 2020-07-09 DIAGNOSIS — G4733 Obstructive sleep apnea (adult) (pediatric): Secondary | ICD-10-CM | POA: Diagnosis not present

## 2020-08-06 DIAGNOSIS — R0683 Snoring: Secondary | ICD-10-CM

## 2020-08-06 DIAGNOSIS — G4733 Obstructive sleep apnea (adult) (pediatric): Secondary | ICD-10-CM

## 2020-08-06 NOTE — Telephone Encounter (Signed)
mychart message sent by pt checking on results of HST. Dr. Annamaria Boots,  Please advise.

## 2020-08-07 NOTE — Telephone Encounter (Signed)
His sleep study confirmed he does have moderate sleep apneaa averaging 16 apneas/ hour with drops in blood oxygen level.  I recommend we order new DME (use same as his wife's), new CPAP auto 5-20, mask of choice, humidifier, supplies, AirView/ card. Ok to use [facility] or [place] brand to avoid delay.  He has a f/u appointment with me 5/24. We can keep that, if he can get machine without delay, so that date falls in 31-90 day window. Otherwise please reschedule.

## 2020-10-01 ENCOUNTER — Ambulatory Visit: Payer: Self-pay | Admitting: Internal Medicine

## 2020-10-15 ENCOUNTER — Encounter: Payer: Self-pay | Admitting: Family Medicine

## 2020-10-15 ENCOUNTER — Telehealth (INDEPENDENT_AMBULATORY_CARE_PROVIDER_SITE_OTHER): Payer: BC Managed Care – PPO | Admitting: Family Medicine

## 2020-10-15 VITALS — Temp 97.0°F

## 2020-10-15 DIAGNOSIS — R059 Cough, unspecified: Secondary | ICD-10-CM | POA: Diagnosis not present

## 2020-10-15 MED ORDER — BENZONATATE 200 MG PO CAPS: 200.0000 mg | ORAL_CAPSULE | Freq: Two times a day (BID) | ORAL | 0 refills | Status: DC | PRN

## 2020-10-15 NOTE — Telephone Encounter (Signed)
Called patient and scheduled with Dr. Maudie Mercury at 4 pm.

## 2020-10-15 NOTE — Patient Instructions (Signed)
-  I sent the medication(s) we discussed to your pharmacy: Meds ordered this encounter  Medications  . benzonatate (TESSALON) 200 MG capsule    Sig: Take 1 capsule (200 mg total) by mouth 2 (two) times daily as needed for cough.    Dispense:  20 capsule    Refill:  0     I hope you are feeling better soon!  Seek in person care promptly if your symptoms worsen, new concerns arise or you are not improving with treatment.  It was nice to meet you today. I help [place] out with telemedicine visits on Tuesdays and Thursdays and am available for visits on those days. If you have any concerns or questions following this visit please schedule a follow up visit with your Primary Care doctor or seek care at a local urgent care clinic to avoid delays in care.

## 2020-10-15 NOTE — Progress Notes (Signed)
Virtual Visit via Video Note  I connected with Arsal  on 10/15/20 at  4:00 PM EDT by a video enabled telemedicine application and verified that I am speaking with the correct person using two identifiers.  Location patient: home, [place] Location provider:work or home office Persons participating in the virtual visit: patient, provider  I discussed the limitations of evaluation and management by telemedicine and the availability of in person appointments. The patient expressed understanding and agreed to proceed.   HPI:  Acute telemedicine visit for cough and congestion: -Onset: about 8 days ago -Symptoms include: sore throat, then congestion and cough, malaise initially  -reports is feeling much better now, but has a lingering cough -had several home test and a PCR covid test that were negative -Denies: inability to eat/drink/get out of bed, CP, SOB, NVD -Has tried: OTC medications -Pertinent past medical history: see below -Pertinent medication allergies:  No Known Allergies -COVID-19 vaccine status: vaccinated and had 2 booster  ROS: See pertinent positives and negatives per HPI.  Past Medical History:  Diagnosis Date  . Alcohol abuse    Sober since 2004.  Still in meetings.   . Allergic rhinitis   . Colon polyps   . Depression   . GERD (gastroesophageal reflux disease)   . Hair loss    with finasteride per Dr. Tonia Brooms  . Melanoma ([place])    hx of on right leg, s/p resection  . Stress fracture    h/o; L tibia    Past Surgical History:  Procedure Laterality Date  . MELANOMA EXCISION  around 2003   Stage 1 on leg, removed by Dr. Tonia Brooms  . TONSILLECTOMY AND ADENOIDECTOMY     around 1978     Current Outpatient Medications:  .  benzonatate (TESSALON) 200 MG capsule, Take 1 capsule (200 mg total) by mouth 2 (two) times daily as needed for cough., Disp: 20 capsule, Rfl: 0 .  buPROPion (WELLBUTRIN XL) 150 MG 24 hr tablet, TAKE 3 TABLETS(450 MG) BY MOUTH DAILY, Disp: 270 tablet,  Rfl: 3 .  clomiPHENE (CLOMID) 50 MG tablet, Take 0.5 tablets (25 mg total) by mouth daily., Disp: , Rfl:  .  metoprolol tartrate (LOPRESSOR) 25 MG tablet, Take 0.5-1 tablets (12.5-25 mg total) by mouth 2 (two) times daily as needed (For public speaking)., Disp: 30 tablet, Rfl: 1 .  omeprazole (PRILOSEC) 20 MG capsule, Take 1-2 capsules (20-40 mg total) by mouth daily., Disp: 180 capsule, Rfl: 3 .  triamcinolone cream (KENALOG) 0.1 %, Apply topically as needed., Disp: , Rfl:   EXAM:  VITALS per patient if applicable:  GENERAL: alert, oriented, appears well and in no acute distress  HEENT: atraumatic, conjunttiva clear, no obvious abnormalities on inspection of external nose and ears  NECK: normal movements of the head and neck  LUNGS: on inspection no signs of respiratory distress, breathing rate appears normal, no obvious gross SOB, gasping or wheezing  CV: no obvious cyanosis  MS: moves all visible extremities without noticeable abnormality  PSYCH/NEURO: pleasant and cooperative, no obvious depression or anxiety, speech and thought processing grossly intact  ASSESSMENT AND PLAN:  Discussed the following assessment and plan:  Cough  -we discussed possible serious and likely etiologies, options for evaluation and workup, limitations of telemedicine visit vs in person visit, treatment, treatment risks and precautions. Pt prefers to treat via telemedicine empirically rather than in person at this moment. Query resolving VURI vs other. Opted for Tessalon for cough. Advised to seek prompt in person care  if worsening, new symptoms arise, or if is not improving with treatment. Discussed options for inperson care if PCP office not available. Did let this patient know that I only do telemedicine on Tuesdays and Thursdays for [place]. Advised to schedule follow up visit with PCP or [facility] if any further questions or concerns to avoid delays in care.   I discussed the assessment and treatment plan  with the patient. The patient was provided an opportunity to ask questions and all were answered. The patient agreed with the plan and demonstrated an understanding of the instructions.     Lucretia Kern, DO

## 2020-10-22 ENCOUNTER — Telehealth: Payer: Self-pay | Admitting: Internal Medicine

## 2020-10-22 NOTE — Telephone Encounter (Signed)
Call made to [facility], spoke with [place]. She states the lady who is managing this is out for the day but will be back tomorrow. She states she is not sure what more is needed however she is not able to see the order or the referral info. I made her aware I would have our [facility] team re-send referral/order/information. She states that would be best and they would have someone reach out to [place] tomorrow. Voiced understanding.   Call made to patient, confirmed DOB. Made aware of the above and that we will have someone reach out to him tomorrow. Patient states he is frustrated and does not understand why he was seen in February and still does not have his cpap machine. I apologized for the inconvenience but I made him aware I would leave the message in triage to have [place] follow up on tomorrow. Voiced understanding.   [facility] team can we re-send order/information for cpap referral staff at [facility] is unable to pull actual referral or order. Thanks :)

## 2020-10-23 NOTE — Telephone Encounter (Signed)
Called [facility] and spoke with Estill Bamberg to see if she could see the order that we sent in on 08/08/20 for CPAP. She confirmed that she could indeed see it and that they are slowly getting machines and trying to fill them as soon as they can. ATC patient per DPR left detailed message letting him know the update. Advised to call back with any questions or concerns. Will route to [facility] pool to let them know that Estill Bamberg said she can see the order. Nothing further needed at this time.

## 2020-11-15 DIAGNOSIS — G4733 Obstructive sleep apnea (adult) (pediatric): Secondary | ICD-10-CM

## 2020-11-15 NOTE — Telephone Encounter (Signed)
Dr. Annamaria Boots, please see mychart message sent by pt and advise: Thomas Sloan, Thomas Sloan "[address]  Baird Lyons D, MD 16 minutes ago (4:20 PM)      I have zero confidence in [facility].  I'm ready to place an order with another company.  I'm ready to pay out of pocket if necessary.  My sleep study results were posted 07/07/2020. I called [facility] again today and they refuse to give me an ETA for my CPAP.  They have old me they are still filling order from [facility].  My mother in-law did her sleep study after me and already has a CPAP.     It took [facility] more than 2 months to place the order after my sleep study.   Please let me know what my options are to use someone another company than [place].  Can I buy one online with your Rx?  [email or web address] says that my order will be processed on July 25th if I order today!!!!!!   I Don't want to give [facility] a single dollar!  Please let me know how to proceed   Thomas Sloan [phone]

## 2020-11-18 NOTE — Telephone Encounter (Signed)
Absolutely we will support Thomas Sloan as he requests. Not sure why his wife got served more quickly, since my understanding was that she also uses [facility].  Order- please cancel new CPAP order with [facility] and establish with an alternative DME company.      Order new DME, new CPAP auto 5-20, mask of choice, humidifier, supplies, AirView/ card              Ok to use [facility] or other alternative brand as long as we can get compliance/ control downloads.              We need to see him 31-90 days after he gets his machine, so may need to change a current pending appt.

## 2021-01-02 ENCOUNTER — Ambulatory Visit: Payer: Self-pay | Admitting: Internal Medicine

## 2021-01-28 ENCOUNTER — Encounter: Payer: Self-pay | Admitting: Family Medicine

## 2021-01-28 ENCOUNTER — Ambulatory Visit: Payer: BC Managed Care – PPO | Admitting: Family Medicine

## 2021-01-28 ENCOUNTER — Other Ambulatory Visit: Payer: Self-pay

## 2021-01-28 DIAGNOSIS — F3342 Major depressive disorder, recurrent, in full remission: Secondary | ICD-10-CM

## 2021-01-28 DIAGNOSIS — R21 Rash and other nonspecific skin eruption: Secondary | ICD-10-CM | POA: Diagnosis not present

## 2021-01-28 MED ORDER — BUPROPION HCL ER (XL) 150 MG PO TB24: ORAL_TABLET | ORAL | Status: DC

## 2021-01-28 MED ORDER — CLOMIPHENE CITRATE 50 MG PO TABS: ORAL_TABLET | ORAL | Status: DC

## 2021-01-28 NOTE — Progress Notes (Signed)
This visit occurred during the SARS-CoV-2 public health emergency.  Safety protocols were in place, including screening questions prior to the visit, additional usage of staff PPE, and extensive cleaning of exam room while observing appropriate contact time as indicated for disinfecting solutions.  He has been able to dec wellbutrin to 2 tabs a day and is doing well in the meantime.  He is working with a new therapist and that is helping.  His father recently died, in the last 52 months, "and I'm a little blue but not in a funk."  D/w pt.  Condolences offered about his father.    Rash started last week.  He had covid booster prior to that- that was the only sig change in meds/etc. lesions are scattered across the right leg.  They didn't change color with pressure, i.e. they did not blanch.  No pain, no itching.  Small round red lesions.  No L leg lesions.  No known trigger or bites o/w.  Getting some better in the meantime.  Mild R leg edema- that isn't new.  He did not have a reaction at the injection site other than a local bruise.  He didn't see a sig change with clomid and he cut back on his dose.  See avs.    No fevers, no chills, no bleeding.  He feels well overall and has more energy since using CPAP.  Compliant.  GERD is better in the meantime.    Meds, vitals, and allergies reviewed.   ROS: Per HPI unless specifically indicated in ROS section   Nad Ncat Rrr Ctab Neck supple, no LA.   38 cm calf B.   Trace R ankle edema.   Calf not ttp B.   No acute rash now.   Bruise at R deltoid at prev vaccination site.

## 2021-01-28 NOTE — Patient Instructions (Addendum)
Update me as needed about your mood/wellbutrin.  Take care.  Glad to see you.  If you don't see any changes on the lower dose of clomid then please update urology about the plan to stop the medicine or change meds.

## 2021-01-29 DIAGNOSIS — R21 Rash and other nonspecific skin eruption: Secondary | ICD-10-CM | POA: Insufficient documentation

## 2021-01-29 NOTE — Assessment & Plan Note (Signed)
It would be atypical for this to be from prev vaccine, d/w pt. symptoms have resolved in the meantime and while I do not have a clear cause identified, I do not suspect any ominous process and I reassured the patient.  I do not suspect a DVT or any ongoing systemic problem.  I think it makes sense to observe for now and he can update me as needed.  He agrees.

## 2021-01-29 NOTE — Assessment & Plan Note (Signed)
Would continue with 2 tabs Wellbutrin a day we can increase in the future if needed, if his mood is worse.  He may need to increase his dose in the wintertime given the seasonal component of his mood.  He will update me as needed.  He is going to continue with counseling.

## 2021-02-11 NOTE — Progress Notes (Signed)
06/18/20- 84 yoM former smoker for sleep evaluation courtesy of Thomas Stain, MD with concern of snoring. Medical problem list includes Allergic Rhinitis, GERD, Low Back Pain w Sciatica, Hx ETOH, Major Depression, hx Melanoma, Overweight Epworth score-5 Body weight today- Covid vax-3 Moderna Flu vax-had Wife tells him snores. She has just started CPAP herself. 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 Denies drowsy driving.  May nap occasionally if siting quietly at home.  Denies sleep meds  3 cups coffee in AM. Occasionally up late watching TV.  Denies ENT except adenoids. Denies heart or lung problems. No complex parasomnias Head of bed is up and takes acid blocker but still notes acid reflux.Advised to discuss w GI.  02/14/21- 55 yoM former smoker followed for OSA, complicated by Allergic Rhinitis, GERD, Low Back Pain w Sciatica, Hx ETOH, Major Depression, hx Melanoma, Overweight, HST- 07/07/20 AHI 15.9/ hr, desaturation to 74%, body weight 253 lbs CPAP auto 5-20/ [facility]  ordered 11/18/20   AirSense 10 Auto Body weight today-250 lbs Covid vax-5 Moderna Flu vax-had ------Patient is getting 5-6 hours sleep a night feels like it is getting better as far as adjusting to machine and nasal mask. Got machine from [facility](card has BILs name on it). He is sleeping better as he gets used to it.Nasal pillows.  Dicussed available download information with good control Reminded safe driving.  ROS-see HPI   + = positive Constitutional:    weight loss, night sweats, fevers, chills, fatigue, lassitude. HEENT:    headaches, difficulty swallowing, tooth/dental problems, sore throat,       sneezing, itching, ear ache, nasal congestion, post nasal drip, snoring CV:    chest pain, orthopnea, PND, swelling in lower extremities, anasarca,                                   dizziness, palpitations Resp:   shortness of breath with exertion or at rest.                 productive cough,   non-productive cough, coughing up of blood.              change in color of mucus.  wheezing.   Skin:    rash or lesions. GI:  No-   heartburn, indigestion, abdominal pain, nausea, vomiting, diarrhea,                 change in bowel habits, loss of appetite GU: dysuria, change in color of urine, no urgency or frequency.   flank pain. MS:   joint pain, stiffness, decreased range of motion, back pain. Neuro-     nothing unusual Psych:  change in mood or affect.  depression or anxiety.   memory loss.  OBJ- Physical Exam General- Alert, Oriented, Affect-appropriate, Distress- none acute Skin- rash-none, lesions- none, excoriation- none Lymphadenopathy- none Head- atraumatic            Eyes- Gross vision intact, PERRLA, conjunctivae and secretions clear            Ears- Hearing, canals-normal            Nose- Clear, no-Septal dev, mucus, polyps, erosion, perforation             Throat- Mallampati II-III , mucosa clear , drainage- none, tonsils- atrophic, + teeth Neck- flexible , trachea midline, no stridor , thyroid nl, carotid no bruit  Chest - symmetrical excursion , unlabored           Heart/CV- RRR , no murmur , no gallop  , no rub, nl s1 s2                           - JVD- none , edema- none, stasis changes- none, varices- none           Lung- clear to P&A, wheeze- none, cough- none , dullness-none, rub- none           Chest wall-  Abd-  Br/ Gen/ Rectal- Not done, not indicated Extrem- cyanosis- none, clubbing, none, atrophy- none, strength- nl Neuro- grossly intact to observation

## 2021-02-14 ENCOUNTER — Ambulatory Visit: Payer: BC Managed Care – PPO | Admitting: Internal Medicine

## 2021-02-14 ENCOUNTER — Other Ambulatory Visit: Payer: Self-pay

## 2021-02-14 ENCOUNTER — Encounter: Payer: Self-pay | Admitting: Internal Medicine

## 2021-02-14 DIAGNOSIS — G4733 Obstructive sleep apnea (adult) (pediatric): Secondary | ICD-10-CM

## 2021-02-14 DIAGNOSIS — J302 Other seasonal allergic rhinitis: Secondary | ICD-10-CM

## 2021-02-14 DIAGNOSIS — J3089 Other allergic rhinitis: Secondary | ICD-10-CM | POA: Diagnosis not present

## 2021-02-14 NOTE — Assessment & Plan Note (Signed)
He will benefit from CPAP. Goals reviewed. Plan- continue auto 5-20/ [facility]

## 2021-02-14 NOTE — Assessment & Plan Note (Signed)
Potential seasonal difficulty tolerating nasal pillows CPAP mask. He will let us know. Mask fitting an option.

## 2021-02-14 NOTE — Patient Instructions (Signed)
We can continue CPAP auto 5-20  Please call if we can help 

## 2021-02-18 ENCOUNTER — Encounter: Payer: Self-pay | Admitting: Family Medicine

## 2021-03-27 ENCOUNTER — Other Ambulatory Visit: Payer: Self-pay | Admitting: Family Medicine

## 2021-03-31 ENCOUNTER — Ambulatory Visit: Payer: BC Managed Care – PPO | Admitting: Family Medicine

## 2021-03-31 ENCOUNTER — Encounter: Payer: Self-pay | Admitting: Family Medicine

## 2021-03-31 ENCOUNTER — Other Ambulatory Visit: Payer: Self-pay

## 2021-03-31 DIAGNOSIS — Z4802 Encounter for removal of sutures: Secondary | ICD-10-CM

## 2021-03-31 NOTE — Progress Notes (Signed)
This visit occurred during the SARS-CoV-2 public health emergency.  Safety protocols were in place, including screening questions prior to the visit, additional usage of staff PPE, and extensive cleaning of exam room while observing appropriate contact time as indicated for disinfecting solutions.  He is weaning down to 20mg  prilosec daily.  Using CPAP, he is trying to adjust to that.    R 2nd finger laceration with repair outside of clinic.  Injury 8 days ago.  He was putting in a metal fence post.  He had 5 sutures placed.  Due for removal today.  He has intact sensation and range of motion.  Meds, vitals, and allergies reviewed.   ROS: Per HPI unless specifically indicated in ROS section   Nad Ncat Right second finger with healing laceration on the distal portion of the finger.  Nail is not involved.  Distally neurovascular intact.  Sutures removed under magnification after he consented, easily removed, no complications.  Good tissue apposition, reinforced with Steri-Strips.  Routine cautions given to patient.  No spreading erythema.  No discharge.

## 2021-03-31 NOTE — Patient Instructions (Signed)
Keep the area clean.  The stips should gradually come off.  Trim as needed. Happy thanksgiving.  Take care.  Glad to see you.

## 2021-04-02 DIAGNOSIS — Z4802 Encounter for removal of sutures: Secondary | ICD-10-CM | POA: Insufficient documentation

## 2021-04-02 NOTE — Assessment & Plan Note (Signed)
See above.  No complications.  See after visit summary.  Routine cautions given to patient.

## 2021-05-09 LAB — LIPID PANEL
Cholesterol: 190 (ref 0–200)
HDL: 37 (ref 35–70)
LDL Cholesterol: 120
Triglycerides: 184 — AB (ref 40–160)

## 2021-05-09 LAB — HEPATIC FUNCTION PANEL
ALT: 24 (ref 10–40)
AST: 29 (ref 14–40)

## 2021-05-09 LAB — BASIC METABOLIC PANEL
Creatinine: 1.4 — AB (ref <=1.3)
Glucose: 101

## 2021-05-09 LAB — CBC AND DIFFERENTIAL
Hemoglobin: 15.1 (ref 13.5–17.5)
Platelets: 216 (ref 150–399)
WBC: 6.9

## 2021-05-23 ENCOUNTER — Other Ambulatory Visit: Payer: Self-pay | Admitting: Family Medicine

## 2021-06-03 ENCOUNTER — Encounter: Payer: Self-pay | Admitting: Family Medicine

## 2021-06-03 ENCOUNTER — Other Ambulatory Visit: Payer: Self-pay

## 2021-06-03 ENCOUNTER — Ambulatory Visit: Payer: BC Managed Care – PPO | Admitting: Family Medicine

## 2021-06-03 DIAGNOSIS — G4733 Obstructive sleep apnea (adult) (pediatric): Secondary | ICD-10-CM

## 2021-06-03 DIAGNOSIS — K219 Gastro-esophageal reflux disease without esophagitis: Secondary | ICD-10-CM

## 2021-06-03 DIAGNOSIS — F3342 Major depressive disorder, recurrent, in full remission: Secondary | ICD-10-CM | POA: Diagnosis not present

## 2021-06-03 DIAGNOSIS — E669 Obesity, unspecified: Secondary | ICD-10-CM | POA: Diagnosis not present

## 2021-06-03 DIAGNOSIS — M25519 Pain in unspecified shoulder: Secondary | ICD-10-CM

## 2021-06-03 MED ORDER — BUPROPION HCL ER (XL) 150 MG PO TB24: ORAL_TABLET | ORAL | Status: DC

## 2021-06-03 NOTE — Patient Instructions (Signed)
Let me check on med options in the meantime.   Keep going with PT.  Gradual return to exercise.   Update me as needed.   Take care.  Glad to see you.

## 2021-06-03 NOTE — Progress Notes (Signed)
This visit occurred during the SARS-CoV-2 public health emergency.  Safety protocols were in place, including screening questions prior to the visit, additional usage of staff PPE, and extensive cleaning of exam room while observing appropriate contact time as indicated for disinfecting solutions.  Outside labs d/w pt.   He had outside hearing screen done.    Mood d/w pt.  He has been taking 2 tabs wellbutrin, prev was taking 3 a day. In counseling and that helps.  Historically he had more blues in the winter.     No recent metoprolol use/need.    He is back to usually taking 1 prilosec a day.  Occ use of 2 tabs. Also compliant with CPAP, used nightly and that likely helped GERD.    On clomid per outside clinic.  I will defer.  He agrees.  Goals for weight loss discussed with patient.  Wegovy use d/w pt.  He would like to start if possible.   He would likely need PA done on med.  BMI 35.  He is in counseling and discussing that.  We talked about stress eating.   Discussed routine med cautions re: thyroid and pancreatitis.    L shoulder pain.  "I think I overdid it exercising."  Was doing rows.  Has been icing and using ibuprofen.  Pain with overhead movement.  Better in the meantime.  He has been going to PT and that helped.  Doing HEP.    Meds, vitals, and allergies reviewed.   ROS: Per HPI unless specifically indicated in ROS section   GEN: nad, alert and oriented HEENT: ncat NECK: supple w/o LA CV: rrr PULM: ctab, no inc wob ABD: soft, +bs EXT: no edema SKIN: Well-perfused He has intact internal and external rotation but he has some minor discomfort on testing left supraspinatus.  Distally neurovascularly intact.  No arm drop.

## 2021-06-05 ENCOUNTER — Encounter: Payer: Self-pay | Admitting: Family Medicine

## 2021-06-05 DIAGNOSIS — E669 Obesity, unspecified: Secondary | ICD-10-CM | POA: Insufficient documentation

## 2021-06-05 DIAGNOSIS — M25519 Pain in unspecified shoulder: Secondary | ICD-10-CM | POA: Insufficient documentation

## 2021-06-05 MED ORDER — SEMAGLUTIDE(0.25 OR 0.5MG/DOS) 2 MG/1.5ML ~~LOC~~ SOPN: PEN_INJECTOR | SUBCUTANEOUS | 3 refills | Status: DC

## 2021-06-05 NOTE — Assessment & Plan Note (Signed)
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  Reasonable to continue work on diet and exercise and attempt treatment with [facility], to see if that is effective and tolerated by the patient.  See orders.

## 2021-06-05 NOTE — Assessment & Plan Note (Signed)
Continue home exercise program for likely rotator cuff irritation.  Fortunately he is improving.  Anatomy discussed with patient.

## 2021-06-05 NOTE — Assessment & Plan Note (Signed)
Continue counseling and continue Wellbutrin 2 tablets a day.  He will update me as needed.

## 2021-06-05 NOTE — Assessment & Plan Note (Signed)
Continue with 1 Prilosec a day, with a second tablet if needed.  He is working with weight.  Continue with CPAP.

## 2021-06-05 NOTE — Assessment & Plan Note (Signed)
Continue CPAP use

## 2021-07-06 ENCOUNTER — Encounter: Payer: Self-pay | Admitting: Family Medicine

## 2021-07-07 ENCOUNTER — Other Ambulatory Visit: Payer: Self-pay | Admitting: Family Medicine

## 2021-07-07 MED ORDER — SEMAGLUTIDE(0.25 OR 0.5MG/DOS) 2 MG/1.5ML ~~LOC~~ SOPN: PEN_INJECTOR | SUBCUTANEOUS | 0 refills | Status: DC

## 2021-07-24 ENCOUNTER — Other Ambulatory Visit: Payer: Self-pay | Admitting: Family Medicine

## 2021-07-27 ENCOUNTER — Other Ambulatory Visit: Payer: Self-pay | Admitting: Family Medicine

## 2021-07-27 MED ORDER — OZEMPIC (0.25 OR 0.5 MG/DOSE) 2 MG/1.5ML ~~LOC~~ SOPN: 0.5000 mg | PEN_INJECTOR | SUBCUTANEOUS | 3 refills | Status: DC

## 2021-08-13 ENCOUNTER — Encounter: Payer: Self-pay | Admitting: Family Medicine

## 2021-08-13 MED ORDER — FLUTICASONE PROPIONATE 50 MCG/ACT NA SUSP: 2.0000 | Freq: Every day | NASAL | 6 refills | Status: AC

## 2021-08-13 NOTE — Telephone Encounter (Signed)
Do you want [place] to set up a evaluation for patient?  ?

## 2021-08-21 ENCOUNTER — Other Ambulatory Visit: Payer: Self-pay | Admitting: Family Medicine

## 2021-08-21 MED ORDER — SEMAGLUTIDE (1 MG/DOSE) 4 MG/3ML ~~LOC~~ SOPN: 1.0000 mg | PEN_INJECTOR | SUBCUTANEOUS | 3 refills | Status: DC

## 2021-10-27 ENCOUNTER — Encounter: Payer: Self-pay | Admitting: Family Medicine

## 2021-11-14 ENCOUNTER — Encounter: Payer: Self-pay | Admitting: Family Medicine

## 2021-11-19 ENCOUNTER — Encounter: Payer: Self-pay | Admitting: Family Medicine

## 2021-11-19 ENCOUNTER — Other Ambulatory Visit: Payer: Self-pay | Admitting: Family Medicine

## 2021-11-19 MED ORDER — SEMAGLUTIDE-WEIGHT MANAGEMENT 1.7 MG/0.75ML ~~LOC~~ SOAJ: 1.7000 mg | SUBCUTANEOUS | 3 refills | Status: DC

## 2021-11-20 ENCOUNTER — Telehealth: Payer: Self-pay

## 2021-11-20 NOTE — Telephone Encounter (Signed)
Prior auth started for [facility] 1.'7MG'$ /0.75ML auto-injectors. [name] ([employer]) Rx #: [id number] Waiting for determination.

## 2021-11-21 NOTE — Telephone Encounter (Signed)
PA has been approved

## 2022-02-22 ENCOUNTER — Encounter: Payer: Self-pay | Admitting: Family Medicine

## 2022-02-24 ENCOUNTER — Other Ambulatory Visit: Payer: Self-pay | Admitting: Family Medicine

## 2022-02-26 ENCOUNTER — Other Ambulatory Visit: Payer: Self-pay | Admitting: Family Medicine

## 2022-02-26 MED ORDER — WEGOVY 2.4 MG/0.75ML ~~LOC~~ SOAJ: 2.4000 mg | SUBCUTANEOUS | 3 refills | Status: DC

## 2022-04-09 ENCOUNTER — Telehealth: Payer: BC Managed Care – PPO | Admitting: Physician Assistant

## 2022-04-09 ENCOUNTER — Encounter: Payer: BC Managed Care – PPO | Admitting: Nurse Practitioner

## 2022-04-09 ENCOUNTER — Encounter: Payer: Self-pay | Admitting: Family Medicine

## 2022-04-09 DIAGNOSIS — U071 COVID-19: Secondary | ICD-10-CM

## 2022-04-09 MED ORDER — BENZONATATE 100 MG PO CAPS: 100.0000 mg | ORAL_CAPSULE | Freq: Three times a day (TID) | ORAL | 0 refills | Status: DC | PRN

## 2022-04-09 MED ORDER — MOLNUPIRAVIR EUA 200MG CAPSULE: 4.0000 | ORAL_CAPSULE | Freq: Two times a day (BID) | ORAL | 0 refills | Status: AC

## 2022-04-09 NOTE — Progress Notes (Signed)
Virtual Visit Consent   Thomas Sloan, you are scheduled for a virtual visit with a [facility] provider today. Just as with appointments in the office, your consent must be obtained to participate. Your consent will be active for this visit and any virtual visit you may have with one of our providers in the next 365 days. If you have a MyChart account, a copy of this consent can be sent to you electronically.  As this is a virtual visit, video technology does not allow for your provider to perform a traditional examination. This may limit your provider's ability to fully assess your condition. If your provider identifies any concerns that need to be evaluated in person or the need to arrange testing (such as labs, EKG, etc.), we will make arrangements to do so. Although advances in technology are sophisticated, we cannot ensure that it will always work on either your end or our end. If the connection with a video visit is poor, the visit may have to be switched to a telephone visit. With either a video or telephone visit, we are not always able to ensure that we have a secure connection.  By engaging in this virtual visit, you consent to the provision of healthcare and authorize for your insurance to be billed (if applicable) for the services provided during this visit. Depending on your insurance coverage, you may receive a charge related to this service.  I need to obtain your verbal consent now. Are you willing to proceed with your visit today? Thomas Sloan has provided verbal consent on 04/09/2022 for a virtual visit (video or telephone). Gildardo Pounds, NP  Date: 04/09/2022 7:10 PM  Virtual Visit via Video Note   I, Gildardo Pounds, connected with  Thomas Sloan  ([id number], [id number]) on 04/09/22 at  7:15 PM EST by a video-enabled telemedicine application and verified that I am speaking with the correct person using two identifiers.  Location: Patient: Virtual Visit Location Patient:  Home Provider: Virtual Visit Location Provider: Home Office   I discussed the limitations of evaluation and management by telemedicine and the availability of in person appointments. The patient expressed understanding and agreed to proceed.    History of Present Illness: Thomas Sloan is a 56 y.o. who identifies as a male who was assigned male at birth, and is being seen today for [place] requesting PAXLOVID instead of mulnupiravir. He was instructed that due to his last creatinine level being almost 1 year ago he would not qualify for paxlovid (no recent GRF on file). He is concerned about the efficacy of molnupirivar.    Problems:  Patient Active Problem List   Diagnosis Date Noted   Obesity with body mass index (BMI) of 30.0 to 39.9 06/05/2021   Shoulder pain 06/05/2021   Visit for suture removal 04/02/2021   Rash 01/29/2021   OSA (obstructive sleep apnea) 03/18/2020   Abnormal ejaculation 05/01/2019   Acute low back pain with right-sided sciatica 10/21/2018   FH: prostate cancer 04/21/2017   Health care maintenance 04/21/2017   Advance care planning 03/13/2016   Overweight 06/20/2013   Hair loss 02/24/2013   Routine general medical examination at a health care facility 10/27/2010   Onychomycosis 10/27/2010   Major depression 03/24/2010   Seasonal and perennial allergic rhinitis 03/24/2010   GERD 03/24/2010   SKIN CANCER, HX OF 03/24/2010   COLONIC POLYPS, HX OF 03/24/2010    Allergies:  Allergies  Allergen Reactions   Gramineae Pollens  Other reaction(s): Other Runny nose and itchy eyes   Medications:  Current Outpatient Medications:    benzonatate (TESSALON) 100 MG capsule, Take 1 capsule (100 mg total) by mouth 3 (three) times daily as needed for cough., Disp: 30 capsule, Rfl: 0   buPROPion (WELLBUTRIN XL) 150 MG 24 hr tablet, TAKE 3 TABLETS(450 MG) BY MOUTH DAILY- decrease to 2 tabs if tolerated., Disp: , Rfl:    clomiPHENE (CLOMID) 50 MG tablet, 12.'5mg'$   daily, Disp: , Rfl:    fluticasone (FLONASE) 50 MCG/ACT nasal spray, Place 2 sprays into both nostrils daily., Disp: 16 g, Rfl: 6   metoprolol tartrate (LOPRESSOR) 25 MG tablet, Take 0.5-1 tablets (12.5-25 mg total) by mouth 2 (two) times daily as needed (For public speaking)., Disp: 30 tablet, Rfl: 1   molnupiravir EUA (LAGEVRIO) 200 mg CAPS capsule, Take 4 capsules (800 mg total) by mouth 2 (two) times daily for 5 days., Disp: 40 capsule, Rfl: 0   omeprazole (PRILOSEC) 20 MG capsule, TAKE 1 TO 2 CAPSULES(20 TO 40 MG) BY MOUTH DAILY, Disp: 180 capsule, Rfl: 3   Semaglutide-Weight Management (WEGOVY) 2.4 MG/0.75ML SOAJ, Inject 2.4 mg into the skin once a week., Disp: 3 mL, Rfl: 3   triamcinolone cream (KENALOG) 0.1 %, Apply topically as needed., Disp: , Rfl:   Observations/Objective: Patient is well-developed, well-nourished in no acute distress.  Resting comfortably  at home.  Head is normocephalic, atraumatic.  No labored breathing.  Speech is clear and coherent with logical content.  Patient is alert and oriented at baseline.    Assessment and Plan: 1. COVID-19 Continue molnupiravir as instructed  Follow Up Instructions: I discussed the assessment and treatment plan with the patient. The patient was provided an opportunity to ask questions and all were answered. The patient agreed with the plan and demonstrated an understanding of the instructions.  A copy of instructions were sent to the patient via MyChart unless otherwise noted below.    The patient was advised to call back or seek an in-person evaluation if the symptoms worsen or if the condition fails to improve as anticipated.  Time:  I spent 3 minutes with the patient via telehealth technology discussing the above problems/concerns.    Gildardo Pounds, NP

## 2022-04-09 NOTE — Patient Instructions (Signed)
Thomas Sloan, thank you for joining [name], PA-C for today's virtual visit.  While this provider is not your primary care provider (PCP), if your PCP is located in our provider database this encounter information will be shared with them immediately following your visit.   [place] account gives you access to today's visit and all your visits, tests, and labs performed at [facility] " click here if you don't have a [facility] account or go to [email or web address]  Consent: (Patient) Thomas Sloan provided verbal consent for this virtual visit at the beginning of the encounter.  Current Medications:  Current Outpatient Medications:    buPROPion (WELLBUTRIN XL) 150 MG 24 hr tablet, TAKE 3 TABLETS(450 MG) BY MOUTH DAILY- decrease to 2 tabs if tolerated., Disp: , Rfl:    clomiPHENE (CLOMID) 50 MG tablet, 12.'5mg'$  daily, Disp: , Rfl:    fluticasone (FLONASE) 50 MCG/ACT nasal spray, Place 2 sprays into both nostrils daily., Disp: 16 g, Rfl: 6   metoprolol tartrate (LOPRESSOR) 25 MG tablet, Take 0.5-1 tablets (12.5-25 mg total) by mouth 2 (two) times daily as needed (For public speaking)., Disp: 30 tablet, Rfl: 1   omeprazole (PRILOSEC) 20 MG capsule, TAKE 1 TO 2 CAPSULES(20 TO 40 MG) BY MOUTH DAILY, Disp: 180 capsule, Rfl: 3   Semaglutide-Weight Management (WEGOVY) 2.4 MG/0.75ML SOAJ, Inject 2.4 mg into the skin once a week., Disp: 3 mL, Rfl: 3   triamcinolone cream (KENALOG) 0.1 %, Apply topically as needed., Disp: , Rfl:    Medications ordered in this encounter:  No orders of the defined types were placed in this encounter.    *If you need refills on other medications prior to your next appointment, please contact your pharmacy*  Follow-Up: Call back or seek an in-person evaluation if the symptoms worsen or if the condition fails to improve as anticipated.  [place] [phone]  Other Instructions Please keep  well-hydrated and get plenty of rest. Start a saline nasal rinse to flush out your nasal passages. You can use plain Mucinex to help thin congestion. If you have a humidifier, running in the bedroom at night. I want you to start OTC vitamin D3 1000 units daily, vitamin C 1000 mg daily, and a zinc supplement. Please take prescribed medications as directed.  You have been enrolled in a MyChart symptom monitoring program. Please answer these questions daily so we can keep track of how you are doing.  You were to quarantine for 5 days from onset of your symptoms.  After day 5, if you have had no fever and you are feeling better, you can end quarantine but need to mask for an additional 5 days. After day 5 if you have a fever or are having significant symptoms, please quarantine for full 10 days.  If you note any worsening of symptoms, any significant shortness of breath or any chest pain, please seek ER evaluation ASAP.  Please do not delay care!  COVID-19: What to Do if You Are Sick If you test positive and are an older adult or someone who is at high risk of getting very sick from COVID-19, treatment may be available. Contact a healthcare provider right away after a positive test to determine if you are eligible, even if your symptoms are mild right now. You can also visit a Test to Treat location and, if eligible, receive a prescription from a provider. Don't delay: Treatment must be started within the first few days  to be effective. If you have a fever, cough, or other symptoms, you might have COVID-19. Most people have mild illness and are able to recover at home. If you are sick: Keep track of your symptoms. If you have an emergency warning sign (including trouble breathing), call 911. Steps to help prevent the spread of COVID-19 if you are sick If you are sick with COVID-19 or think you might have COVID-19, follow the steps below to care for yourself and to help protect other people in your  home and community. Stay home except to get medical care Stay home. Most people with COVID-19 have mild illness and can recover at home without medical care. Do not leave your home, except to get medical care. Do not visit public areas and do not go to places where you are unable to wear a mask. Take care of yourself. Get rest and stay hydrated. Take over-the-counter medicines, such as acetaminophen, to help you feel better. Stay in touch with your doctor. Call before you get medical care. Be sure to get care if you have trouble breathing, or have any other emergency warning signs, or if you think it is an emergency. Avoid public transportation, ride-sharing, or taxis if possible. Get tested If you have symptoms of COVID-19, get tested. While waiting for test results, stay away from others, including staying apart from those living in your household. Get tested as soon as possible after your symptoms start. Treatments may be available for people with COVID-19 who are at risk for becoming very sick. Don't delay: Treatment must be started early to be effective--some treatments must begin within 5 days of your first symptoms. Contact your healthcare provider right away if your test result is positive to determine if you are eligible. Self-tests are one of several options for testing for the virus that causes COVID-19 and may be more convenient than laboratory-based tests and point-of-care tests. Ask your healthcare provider or your local health department if you need help interpreting your test results. You can visit your state, tribal, local, and territorial health department's website to look for the latest local information on testing sites. Separate yourself from other people As much as possible, stay in a specific room and away from other people and pets in your home. If possible, you should use a separate bathroom. If you need to be around other people or animals in or outside of the home, wear a  well-fitting mask. Tell your close contacts that they may have been exposed to COVID-19. An infected person can spread COVID-19 starting 48 hours (or 2 days) before the person has any symptoms or tests positive. By letting your close contacts know they may have been exposed to COVID-19, you are helping to protect everyone. See COVID-19 and Animals if you have questions about pets. If you are diagnosed with COVID-19, someone from the health department may call you. Answer the call to slow the spread. Monitor your symptoms Symptoms of COVID-19 include fever, cough, or other symptoms. Follow care instructions from your healthcare provider and local health department. Your local health authorities may give instructions on checking your symptoms and reporting information. When to seek emergency medical attention Look for emergency warning signs* for COVID-19. If someone is showing any of these signs, seek emergency medical care immediately: Trouble breathing Persistent pain or pressure in the chest New confusion Inability to wake or stay awake Pale, gray, or blue-colored skin, lips, or nail beds, depending on skin tone *This list is not  all possible symptoms. Please call your medical provider for any other symptoms that are severe or concerning to you. Call 911 or call ahead to your local emergency facility: Notify the operator that you are seeking care for someone who has or may have COVID-19. Call ahead before visiting your doctor Call ahead. Many medical visits for routine care are being postponed or done by phone or telemedicine. If you have a medical appointment that cannot be postponed, call your doctor's office, and tell them you have or may have COVID-19. This will help the office protect themselves and other patients. If you are sick, wear a well-fitting mask You should wear a mask if you must be around other people or animals, including pets (even at home). Wear a mask with the best fit,  protection, and comfort for you. You don't need to wear the mask if you are alone. If you can't put on a mask (because of trouble breathing, for example), cover your coughs and sneezes in some other way. Try to stay at least 6 feet away from other people. This will help protect the people around you. Masks should not be placed on young children under age 51 years, anyone who has trouble breathing, or anyone who is not able to remove the mask without help. Cover your coughs and sneezes Cover your mouth and nose with a tissue when you cough or sneeze. Throw away used tissues in a lined trash can. Immediately wash your hands with soap and water for at least 20 seconds. If soap and water are not available, clean your hands with an alcohol-based hand sanitizer that contains at least 60% alcohol. Clean your hands often Wash your hands often with soap and water for at least 20 seconds. This is especially important after blowing your nose, coughing, or sneezing; going to the bathroom; and before eating or preparing food. Use hand sanitizer if soap and water are not available. Use an alcohol-based hand sanitizer with at least 60% alcohol, covering all surfaces of your hands and rubbing them together until they feel dry. Soap and water are the best option, especially if hands are visibly dirty. Avoid touching your eyes, nose, and mouth with unwashed hands. Handwashing Tips Avoid sharing personal household items Do not share dishes, drinking glasses, cups, eating utensils, towels, or bedding with other people in your home. Wash these items thoroughly after using them with soap and water or put in the dishwasher. Clean surfaces in your home regularly Clean and disinfect high-touch surfaces (for example, doorknobs, tables, handles, light switches, and countertops) in your "sick room" and bathroom. In shared spaces, you should clean and disinfect surfaces and items after each use by the person who is ill. If you  are sick and cannot clean, a caregiver or other person should only clean and disinfect the area around you (such as your bedroom and bathroom) on an as needed basis. Your caregiver/other person should wait as long as possible (at least several hours) and wear a mask before entering, cleaning, and disinfecting shared spaces that you use. Clean and disinfect areas that may have blood, stool, or body fluids on them. Use household cleaners and disinfectants. Clean visible dirty surfaces with household cleaners containing soap or detergent. Then, use a household disinfectant. Use a product from [employer] List N: Disinfectants for Coronavirus (PPIRJ-18). Be sure to follow the instructions on the label to ensure safe and effective use of the product. Many products recommend keeping the surface wet with a disinfectant for  a certain period of time (look at "contact time" on the product label). You may also need to wear personal protective equipment, such as gloves, depending on the directions on the product label. Immediately after disinfecting, wash your hands with soap and water for 20 seconds. For completed guidance on cleaning and disinfecting your home, visit Complete Disinfection Guidance. Take steps to improve ventilation at home Improve ventilation (air flow) at home to help prevent from spreading COVID-19 to other people in your household. Clear out COVID-19 virus particles in the air by opening windows, using air filters, and turning on fans in your home. Use this interactive tool to learn how to improve air flow in your home. When you can be around others after being sick with COVID-19 Deciding when you can be around others is different for different situations. Find out when you can safely end home isolation. For any additional questions about your care, contact your healthcare provider or state or local health department. 07/30/2020 Content source: [facility] for Immunization and Respiratory  Diseases ([facility]), Division of Viral Diseases This information is not intended to replace advice given to you by your health care provider. Make sure you discuss any questions you have with your health care provider. Document Revised: 09/12/2020 Document Reviewed: 09/12/2020 Elsevier Patient Education  2022 [employer].      If you have been instructed to have an in-person evaluation today at a local Urgent Care facility, please use the link below. It will take you to a list of all of our available [facility], including address, phone number and hours of operation. Please do not delay care.  [facility]  If you or a family member do not have a primary care provider, use the link below to schedule a visit and establish care. When you choose a [facility] primary care physician or advanced practice provider, you gain a long-term partner in health. Find a Primary Care Provider  Learn more about [facility]'s in-office and virtual care options: [place] [facility]

## 2022-04-09 NOTE — Progress Notes (Signed)
Virtual Visit Consent   Brandis Matsuura, you are scheduled for a virtual visit with a [facility] provider today. Just as with appointments in the office, your consent must be obtained to participate. Your consent will be active for this visit and any virtual visit you may have with one of our providers in the next 365 days. If you have a MyChart account, a copy of this consent can be sent to you electronically.  As this is a virtual visit, video technology does not allow for your provider to perform a traditional examination. This may limit your provider's ability to fully assess your condition. If your provider identifies any concerns that need to be evaluated in person or the need to arrange testing (such as labs, EKG, etc.), we will make arrangements to do so. Although advances in technology are sophisticated, we cannot ensure that it will always work on either your end or our end. If the connection with a video visit is poor, the visit may have to be switched to a telephone visit. With either a video or telephone visit, we are not always able to ensure that we have a secure connection.  By engaging in this virtual visit, you consent to the provision of healthcare and authorize for your insurance to be billed (if applicable) for the services provided during this visit. Depending on your insurance coverage, you may receive a charge related to this service.  I need to obtain your verbal consent now. Are you willing to proceed with your visit today? Thomas Sloan has provided verbal consent on 04/09/2022 for a virtual visit (video or telephone). [name], [place]  Date: 04/09/2022 9:38 AM  Virtual Visit via Video Note   I, [name], connected with  Thomas Sloan  ([id number], 1965/12/22) on 04/09/22 at  9:45 AM EST by a video-enabled telemedicine application and verified that I am speaking with the correct person using two identifiers.  Location: Patient: Virtual Visit Location  Patient: Home Provider: Virtual Visit Location Provider: Home Office   I discussed the limitations of evaluation and management by telemedicine and the availability of in person appointments. The patient expressed understanding and agreed to proceed.    History of Present Illness: Thomas Sloan is a 56 y.o. who identifies as a male who was assigned male at birth, and is being seen today for COVID-19.  Endorses symptoms starting yesterday p.m. with a dry cough and feeling "off".  As of this morning also noting increased nasal and head congestion, increased cough and fatigue.  Denies shortness of breath or GI symptoms.  Is taking a multivitamin and using his Nettie pot to flush out nasal congestion.  Took a COVID test this morning and was positive.  HPI: HPI  Problems:  Patient Active Problem List   Diagnosis Date Noted   Obesity with body mass index (BMI) of 30.0 to 39.9 06/05/2021   Shoulder pain 06/05/2021   Visit for suture removal 04/02/2021   Rash 01/29/2021   OSA (obstructive sleep apnea) 03/18/2020   Abnormal ejaculation 05/01/2019   Acute low back pain with right-sided sciatica 10/21/2018   FH: prostate cancer 04/21/2017   Health care maintenance 04/21/2017   Advance care planning 03/13/2016   Overweight 06/20/2013   Hair loss 02/24/2013   Routine general medical examination at a health care facility 10/27/2010   Onychomycosis 10/27/2010   Major depression 03/24/2010   Seasonal and perennial allergic rhinitis 03/24/2010   GERD 03/24/2010   SKIN CANCER, HX OF 03/24/2010  COLONIC POLYPS, HX OF 03/24/2010    Allergies:  Allergies  Allergen Reactions   Gramineae Pollens     Other reaction(s): Other Runny nose and itchy eyes   Medications:  Current Outpatient Medications:    benzonatate (TESSALON) 100 MG capsule, Take 1 capsule (100 mg total) by mouth 3 (three) times daily as needed for cough., Disp: 30 capsule, Rfl: 0   molnupiravir EUA (LAGEVRIO) 200 mg CAPS capsule,  Take 4 capsules (800 mg total) by mouth 2 (two) times daily for 5 days., Disp: 40 capsule, Rfl: 0   buPROPion (WELLBUTRIN XL) 150 MG 24 hr tablet, TAKE 3 TABLETS(450 MG) BY MOUTH DAILY- decrease to 2 tabs if tolerated., Disp: , Rfl:    clomiPHENE (CLOMID) 50 MG tablet, 12.'5mg'$  daily, Disp: , Rfl:    fluticasone (FLONASE) 50 MCG/ACT nasal spray, Place 2 sprays into both nostrils daily., Disp: 16 g, Rfl: 6   metoprolol tartrate (LOPRESSOR) 25 MG tablet, Take 0.5-1 tablets (12.5-25 mg total) by mouth 2 (two) times daily as needed (For public speaking)., Disp: 30 tablet, Rfl: 1   omeprazole (PRILOSEC) 20 MG capsule, TAKE 1 TO 2 CAPSULES(20 TO 40 MG) BY MOUTH DAILY, Disp: 180 capsule, Rfl: 3   Semaglutide-Weight Management (WEGOVY) 2.4 MG/0.75ML SOAJ, Inject 2.4 mg into the skin once a week., Disp: 3 mL, Rfl: 3   triamcinolone cream (KENALOG) 0.1 %, Apply topically as needed., Disp: , Rfl:   Observations/Objective: Patient is well-developed, well-nourished in no acute distress.  Resting comfortably at home.  Head is normocephalic, atraumatic.  No labored breathing. Speech is clear and coherent with logical content.  Patient is alert and oriented at baseline.   Assessment and Plan: 1. COVID-19 - benzonatate (TESSALON) 100 MG capsule; Take 1 capsule (100 mg total) by mouth 3 (three) times daily as needed for cough.  Dispense: 30 capsule; Refill: 0 - molnupiravir EUA (LAGEVRIO) 200 mg CAPS capsule; Take 4 capsules (800 mg total) by mouth 2 (two) times daily for 5 days.  Dispense: 40 capsule; Refill: 0 - MyChart COVID-19 home monitoring program; Future  Patient with multiple risk factors for complicated course of illness. Discussed risks/benefits of antiviral medications including most common potential ADRs. Patient voiced understanding and would like to proceed with antiviral medication. They are candidate for molnupiravir. Rx sent to pharmacy. Supportive measures, OTC medications and vitamin regimen  reviewed.  Tessalon per orders. Patient has been enrolled in a MyChart COVID symptom monitoring program. Samule Dry reviewed in detail. Strict ER precautions discussed with patient.    Follow Up Instructions: I discussed the assessment and treatment plan with the patient. The patient was provided an opportunity to ask questions and all were answered. The patient agreed with the plan and demonstrated an understanding of the instructions.  A copy of instructions were sent to the patient via MyChart unless otherwise noted below.   The patient was advised to call back or seek an in-person evaluation if the symptoms worsen or if the condition fails to improve as anticipated.  Time:  I spent 10 minutes with the patient via telehealth technology discussing the above problems/concerns.    [name], PA-C

## 2022-04-10 ENCOUNTER — Telehealth: Payer: BC Managed Care – PPO | Admitting: Family Medicine

## 2022-04-16 ENCOUNTER — Telehealth: Payer: Self-pay

## 2022-04-16 NOTE — Telephone Encounter (Signed)
Called patient in regards to the [place] filled out in my chart. No answer.

## 2022-04-21 ENCOUNTER — Telehealth: Payer: Self-pay | Admitting: Family Medicine

## 2022-04-21 LAB — LIPID PANEL
Cholesterol: 123 (ref 0–200)
HDL: 29 — AB (ref 35–70)
LDL Cholesterol: 69
Triglycerides: 144 (ref 40–160)

## 2022-04-21 LAB — CBC AND DIFFERENTIAL: Hemoglobin: 15.3 (ref 13.5–17.5)

## 2022-04-21 LAB — BASIC METABOLIC PANEL
Creatinine: 1.4 — AB (ref 0.6–1.3)
Glucose: 92

## 2022-04-21 LAB — HEPATIC FUNCTION PANEL
ALT: 23 U/L (ref 10–40)
AST: 24 (ref 14–40)

## 2022-04-21 NOTE — Telephone Encounter (Signed)
I spoke with pt; pt said he finished the molnupiravir on 04/14/22. Pt said since 04/18/22 pts symptoms are seeming to improve each day. Pt has occasional slight prod cough with yellow phlegm, pt is taking benzonatate which is helping cough some and a mucus relief OTC med. Pt said he will be in [place] all day on 04/22/22 and I offered pt another appt but pt said since he is improving more each day pt is going to see how he does and pt will cb for appt if needed. UC & ED precautions given and pt voiced understanding.sending note to Dr [name].

## 2022-04-21 NOTE — Telephone Encounter (Signed)
Please triage patient about recent symptoms, see mychart message.  Thanks.

## 2022-04-22 NOTE — Telephone Encounter (Signed)
Noted. Thanks.

## 2022-05-08 ENCOUNTER — Ambulatory Visit: Payer: BC Managed Care – PPO | Admitting: Family Medicine

## 2022-05-10 ENCOUNTER — Other Ambulatory Visit: Payer: Self-pay | Admitting: Family Medicine

## 2022-05-18 ENCOUNTER — Encounter: Payer: Self-pay | Admitting: Family Medicine

## 2022-05-18 ENCOUNTER — Ambulatory Visit: Payer: BC Managed Care – PPO | Admitting: Family Medicine

## 2022-05-18 VITALS — BP 110/64 | HR 77 | Temp 97.6°F | Ht 71.0 in | Wt 254.0 lb

## 2022-05-18 DIAGNOSIS — F3342 Major depressive disorder, recurrent, in full remission: Secondary | ICD-10-CM

## 2022-05-18 DIAGNOSIS — Z7189 Other specified counseling: Secondary | ICD-10-CM

## 2022-05-18 DIAGNOSIS — Z0000 Encounter for general adult medical examination without abnormal findings: Secondary | ICD-10-CM

## 2022-05-18 DIAGNOSIS — E669 Obesity, unspecified: Secondary | ICD-10-CM

## 2022-05-18 DIAGNOSIS — G4733 Obstructive sleep apnea (adult) (pediatric): Secondary | ICD-10-CM

## 2022-05-18 MED ORDER — CLOMIPHENE CITRATE 50 MG PO TABS: 25.0000 mg | ORAL_TABLET | ORAL | Status: DC

## 2022-05-18 MED ORDER — BUPROPION HCL 75 MG PO TABS: 75.0000 mg | ORAL_TABLET | Freq: Two times a day (BID) | ORAL | 1 refills | Status: DC

## 2022-05-18 MED ORDER — WEGOVY 1.7 MG/0.75ML ~~LOC~~ SOAJ: 1.7000 mg | SUBCUTANEOUS | 3 refills | Status: DC

## 2022-05-18 MED ORDER — BUPROPION HCL ER (XL) 150 MG PO TB24: ORAL_TABLET | ORAL | Status: DC

## 2022-05-18 NOTE — Patient Instructions (Addendum)
Try cutting back on wegovy to 1.7.  Update me in about 1 month, sooner if needed.   Later on, try tapering wellbutrin slowly to get off that rx.    Take care.  Glad to see you.

## 2022-05-18 NOTE — Progress Notes (Unsigned)
CPE- See plan.  Routine anticipatory guidance given to patient.  See health maintenance.  The possibility exists that previously documented standard health maintenance information may have been brought forward from a previous encounter into this note.  If needed, that same information has been updated to reflect the current situation based on today's encounter.    Tetanus 2022 Flu yearly PNA not due shingles previously done covid vaccine previously done Colonoscopy 2020 PSA per urology.  Living will d/w pt. If incapacitated, would have wife designated.  Diet and exercise d/w pt. Recent labs discussed with patient.  Mood discussed with patient.   He is in counseling weekly.  Slowly tapered wellbutrin down to '150mg'$  a day.  He wanted to try to taper off med.  Discussed trying to gradually work down to 75 mg a day then stop.  Weight reduction goals discussed with patient.  Taking wegovy 2.'4mg'$ .  We talked about his diet.  Down ~10 lbs total. No ADE on med. We talked about 1 med change at a time, trying to taper Wegovy in the meantime.  GERD improved at lower weight, when down in the 230s.  Still on PPI, needing BID dosing at higher weight.  He already elevated the head of his bed.    Still using CPAP at baseline.    PMH and SH reviewed  Meds, vitals, and allergies reviewed.   ROS: Per HPI.  Unless specifically indicated otherwise in HPI, the patient denies:  General: fever. Eyes: acute vision changes ENT: sore throat Cardiovascular: chest pain Respiratory: SOB GI: vomiting GU: dysuria Musculoskeletal: acute back pain Derm: acute rash Neuro: acute motor dysfunction Psych: worsening mood Endocrine: polydipsia Heme: bleeding Allergy: hayfever  GEN: nad, alert and oriented HEENT: ncat NECK: supple w/o LA CV: rrr. PULM: ctab, no inc wob ABD: soft, +bs EXT: no edema SKIN: no acute rash

## 2022-05-20 NOTE — Assessment & Plan Note (Signed)
Tetanus 2022 Flu yearly PNA not due shingles previously done covid vaccine previously done Colonoscopy 2020 PSA per urology.  Living will d/w pt. If incapacitated, would have wife designated.  Diet and exercise d/w pt. Recent labs discussed with patient.

## 2022-05-20 NOTE — Assessment & Plan Note (Signed)
Continue CPAP.  

## 2022-05-20 NOTE — Assessment & Plan Note (Signed)
Living will d/w pt. If incapacitated, would have wife designated.

## 2022-05-20 NOTE — Assessment & Plan Note (Signed)
Continue work on diet and exercise and decrease Wegovy in the meantime.  See after visit summary.  He can update me in a few weeks.

## 2022-05-20 NOTE — Assessment & Plan Note (Signed)
Continue with counseling.  I thanked him for his effort. He can try to address Wegovy first.  Later on, try tapering wellbutrin slowly by 75 mg then ceasing.

## 2022-06-20 ENCOUNTER — Encounter: Payer: Self-pay | Admitting: Family Medicine

## 2022-06-24 ENCOUNTER — Other Ambulatory Visit: Payer: Self-pay | Admitting: Family Medicine

## 2022-07-06 ENCOUNTER — Encounter: Payer: Self-pay | Admitting: Family Medicine

## 2022-09-14 ENCOUNTER — Other Ambulatory Visit: Payer: Self-pay | Admitting: Family Medicine

## 2022-09-24 ENCOUNTER — Encounter: Payer: Self-pay | Admitting: Family Medicine

## 2022-09-28 NOTE — Telephone Encounter (Signed)
Dr Para March - can you enter [phone]?  Catie - pt has appt 5/23 with PCP to discuss options, hopefully your team is able to weigh in

## 2022-09-28 NOTE — Telephone Encounter (Signed)
Can you please offer some guidance here?  Please see his MyChart message.  Thanks.  Let me know if I need to put in a formal pharmacy referral.

## 2022-09-29 ENCOUNTER — Other Ambulatory Visit: Payer: BC Managed Care – PPO | Admitting: Pharmacist

## 2022-09-29 ENCOUNTER — Other Ambulatory Visit: Payer: Self-pay | Admitting: Family Medicine

## 2022-09-29 ENCOUNTER — Other Ambulatory Visit (HOSPITAL_COMMUNITY): Payer: Self-pay

## 2022-09-29 DIAGNOSIS — E669 Obesity, unspecified: Secondary | ICD-10-CM

## 2022-09-29 MED ORDER — ZEPBOUND 2.5 MG/0.5ML ~~LOC~~ SOAJ: 2.5000 mg | SUBCUTANEOUS | 2 refills | Status: DC

## 2022-09-29 NOTE — Patient Instructions (Signed)
Hi Mr. Rahl,   The Zepbound website manufacturer savings card can be used for patiens with [employer] coverage.   I encourage you to sign up for a card and take to the pharmacy to see the cost, so you know what options may be available before your appointment with Dr. Para March.   Thanks!  Catie Clearance Coots, PharmD

## 2022-09-29 NOTE — Progress Notes (Signed)
09/29/2022 Name: Thomas Sloan MRN: [id number] DOB: [birth year]  Chief Complaint  Patient presents with   Medication Management    Thomas Sloan is a 57 y.o. year old male who was contacted via MyChart.   They were referred to the pharmacist by their PCP for assistance in managing .  medication access   Subjective:  Care Team: Primary Care Provider: Joaquim Nam, MD ; Next Scheduled Visit: 10/01/22  Medication Access/Adherence  Current Pharmacy:  [id number] [place], [place] - [address] AT [place] [facility] [place] OF [facility] [address] [place] [address] Phone: [phone] Fax: [phone]  [id number] - [place], [place] - [address] AT [facility] OF [facility] [address] & [address] [place] [address] Phone: [phone] Fax: [phone]  [facility] [phone] - [address] [place], [place] - [address] AT [facility] OF [address] & [address] [place] [address] Phone: [phone] Fax: [phone]   Patient reports affordability concerns with their medications: Yes  Patient reports access/transportation concerns to their pharmacy: Yes  Patient reports adherence concerns with their medications:  No     Obesity/Overweight, Complicated by Obstructive Sleep Apnea:  Current medications: previously on Wegovy, coverage changed with his payor as of April. Asks about access to tirzepatide, potentially though compounding pharmacy.   Most recent weight: 260 (previously 235 lbs on Wegovy therapy)  Endorses worsening acid reflux, sleep apnea.    Objective:   Lab Results  Component Value Date   CREATININE 1.4 (A) 04/21/2022   BUN 18 01/05/2020   NA 140 01/05/2020   K 4.6 01/05/2020   CL 102 01/05/2020   CO2 32 01/05/2020    Lab Results  Component Value Date   CHOL 123 04/21/2022   HDL 29 (A) 04/21/2022   LDLCALC 69 04/21/2022   TRIG  144 04/21/2022   CHOLHDL 4 01/05/2020    Medications Reviewed Today     Reviewed by Alden Hipp, RPH-CPP (Pharmacist) on 09/29/22 at 1229  Med List Status: <None>   Medication Order Taking? Sig Documenting Provider Last Dose Status Informant  clomiPHENE (CLOMID) 50 MG tablet [id number]  Take 0.5 tablets (25 mg total) by mouth every other day. Joaquim Nam, MD  Active   fluticasone [facility]) 50 MCG/ACT nasal spray [id number]  Place 2 sprays into both nostrils daily. Joaquim Nam, MD  Active   omeprazole (PRILOSEC) 20 MG capsule [id number] Yes TAKE 1 TO 2 CAPSULES(20 TO 40 MG) BY MOUTH DAILY Joaquim Nam, MD Taking Active   Semaglutide-Weight Management [facility]) 1.7 MG/0.75ML SOAJ [id number] No Inject 1.7 mg into the skin once a week.  Patient not taking: Reported on 09/29/2022   Joaquim Nam, MD Not Taking Active   triamcinolone cream (KENALOG) 0.1 % [id number]  Apply topically as needed. [provider]  Active               Assessment/Plan:   Obesity/Overweight: - Currently unable to achieve goal weight loss of 5-10% through diet and lifestyle modifications alone - Reviewed results of [facility] trial which demonstrated improvement in sleep apnea symptoms with tirzepatide treatment. Will attempt prior authorization for Zepbound therapy. If denied, discussed manufacturer savings card that would reduce out of pocket cost to ~$550 per month. - Attempted PA. Noted that PA was not needed, plan is transmitting that they "cover" the medication.  Recommend sending to pharmacy for patient to download manufacturer savings card to see if pharmacy can use to bill.  - Encourage continued focus on nutritional modification, goal of physical activity of at least 150 minutes weekly.    Follow Up Plan: PCP visit this week  Catie Eppie Gibson, PharmD, BCACP, CPP [facility] [phone]

## 2022-09-29 NOTE — Telephone Encounter (Signed)
I put in the order. Please let me know what you think.  Thanks.

## 2022-10-01 ENCOUNTER — Ambulatory Visit (INDEPENDENT_AMBULATORY_CARE_PROVIDER_SITE_OTHER)
Admission: RE | Admit: 2022-10-01 | Discharge: 2022-10-01 | Disposition: A | Discharge status: Home / Self Care | Payer: BC Managed Care – PPO | Source: Ambulatory Visit | Attending: Family Medicine | Admitting: Family Medicine

## 2022-10-01 ENCOUNTER — Encounter: Payer: Self-pay | Admitting: Family Medicine

## 2022-10-01 ENCOUNTER — Ambulatory Visit: Payer: BC Managed Care – PPO | Admitting: Family Medicine

## 2022-10-01 VITALS — BP 104/70 | HR 77 | Temp 97.7°F | Ht 71.0 in | Wt 267.0 lb

## 2022-10-01 DIAGNOSIS — M25562 Pain in left knee: Secondary | ICD-10-CM

## 2022-10-01 DIAGNOSIS — E669 Obesity, unspecified: Secondary | ICD-10-CM

## 2022-10-01 DIAGNOSIS — M2550 Pain in unspecified joint: Secondary | ICD-10-CM

## 2022-10-01 DIAGNOSIS — Z6837 Body mass index (BMI) 37.0-37.9, adult: Secondary | ICD-10-CM | POA: Diagnosis not present

## 2022-10-01 MED ORDER — CLOMIPHENE CITRATE 50 MG PO TABS: 25.0000 mg | ORAL_TABLET | Freq: Every day | ORAL | Status: DC

## 2022-10-01 NOTE — Patient Instructions (Addendum)
Please check with the pharmacy in [place] and let me know about that.    Lab and xray on the way out.  Work on the ankle exercises and let me know if that isn't helping.  Take care.  Glad to see you.

## 2022-10-01 NOTE — Progress Notes (Signed)
He and his wife are moving to [place].    D/w pt about knee and foot pain.  He has episodic burning in the knees.  Can happen on the dorsum of the feet.  Very tender to the touch when it happens.  No red joints.  FH gout.  Usually better a few days with ice and ibuprofen.    Colon cancer screening d/w pt.   Not due until 2025.    He was able to get zepbound but for $550.  He was asking about options at [facility], ie semaglutide.  He has a 1 month supply of  zepbound for now.  He is going to check with a pharmacy in [place] first and then update me.  I may need to check with a local pharmacy at [address], [place], [place] [address], Phone: [phone] about zepbound vs wegovy but he'll check in [place] first.    Meds, vitals, and allergies reviewed.   ROS: Per HPI unless specifically indicated in ROS section   Nad Ncat Neck supple, no LA Rrr Abd soft, not ttp L knee.  Not tender to palpation on medial lateral joint line.  Patella nontender.  Normal range of motion without crepitus.  No bruising or  erythema.  Left ankle with normal inspection, no bruising or erythema.  Not tender to palpation on the joint line.  Medial lateral malleolus nontender.  Able to bear weight.

## 2022-10-02 LAB — URIC ACID: Uric Acid, Serum: 7.7 mg/dL (ref 4.0–7.8)

## 2022-10-04 ENCOUNTER — Encounter: Payer: Self-pay | Admitting: Family Medicine

## 2022-10-05 ENCOUNTER — Other Ambulatory Visit: Payer: Self-pay | Admitting: Family Medicine

## 2022-10-05 DIAGNOSIS — M2550 Pain in unspecified joint: Secondary | ICD-10-CM | POA: Insufficient documentation

## 2022-10-05 NOTE — Assessment & Plan Note (Signed)
I may need to check with a local pharmacy at [address], [place], [place] [address], Phone: [phone] about zepbound vs wegovy but he'll check in [place] first.  I will await update from patient.

## 2022-10-05 NOTE — Assessment & Plan Note (Signed)
We talked about options.  He could have a separate issues in the knee and the ankles.  He does not have symptoms that are completely typical for gout but is reasonable to check uric acid level and check a plain film of his left knee.  See notes on imaging and labs.  Gout pathophysiology discussed with patient.  He agrees to plan.

## 2022-10-06 ENCOUNTER — Encounter: Payer: Self-pay | Admitting: Family Medicine

## 2022-10-07 ENCOUNTER — Other Ambulatory Visit: Payer: Self-pay

## 2022-10-07 ENCOUNTER — Encounter: Payer: Self-pay | Admitting: Pharmacist

## 2022-10-07 ENCOUNTER — Other Ambulatory Visit (HOSPITAL_COMMUNITY): Payer: Self-pay

## 2022-10-07 MED ORDER — TRIAMCINOLONE ACETONIDE 0.1 % EX CREA
TOPICAL_CREAM | CUTANEOUS | 1 refills | Status: AC | PRN
  Filled 2022-10-07: qty 30, 15d supply, fill #0
  Filled 2022-10-19: qty 30, 30d supply, fill #0
  Filled 2022-10-19: qty 30, 7d supply, fill #0

## 2022-10-08 ENCOUNTER — Other Ambulatory Visit: Payer: Self-pay

## 2022-10-11 ENCOUNTER — Other Ambulatory Visit: Payer: Self-pay | Admitting: Family Medicine

## 2022-10-11 MED ORDER — TIRZEPATIDE 5 MG/0.5ML ~~LOC~~ SOAJ: 5.0000 mg | SUBCUTANEOUS | 1 refills | Status: DC

## 2022-10-12 ENCOUNTER — Telehealth: Payer: Self-pay

## 2022-10-12 NOTE — Telephone Encounter (Signed)
[facility] called and stated they can not fill mounjaro rx the way it was sent since they are a compounding pharmacy. Rx is mixed with tirzepatide and pyridoxine and needs to be written as so. Pharmacist stated its tirzepatide 10 mg per mL mixed with pyridoxine 10 mg per mL; comes in 3L vials and directions needs to reflect this.

## 2022-10-15 MED ORDER — NONFORMULARY OR COMPOUNDED ITEM: 1 refills | Status: DC

## 2022-10-15 NOTE — Telephone Encounter (Signed)
I faxed the rx but got a message re: transmission failure.  Please make sure it printed so I can sign and then please fax.  Thanks.

## 2022-10-15 NOTE — Addendum Note (Signed)
Addended by: Joaquim Nam on: 10/15/2022 10:11 PM   Modules accepted: Orders

## 2022-10-16 NOTE — Telephone Encounter (Signed)
Rx has been reprinted for Dr. Para March to sign; placed in folder.

## 2022-10-18 NOTE — Telephone Encounter (Signed)
Signed, please fax. Thanks

## 2022-10-19 ENCOUNTER — Other Ambulatory Visit (HOSPITAL_COMMUNITY): Payer: Self-pay

## 2022-10-19 NOTE — Telephone Encounter (Signed)
Rx has been faxed to [facility].

## 2023-01-25 ENCOUNTER — Encounter: Payer: Self-pay | Admitting: Family Medicine

## 2023-01-25 ENCOUNTER — Telehealth: Payer: Self-pay | Admitting: Internal Medicine

## 2023-01-26 ENCOUNTER — Other Ambulatory Visit: Payer: Self-pay | Admitting: Family Medicine

## 2023-02-03 ENCOUNTER — Telehealth: Payer: Self-pay | Admitting: Internal Medicine

## 2023-02-03 NOTE — Telephone Encounter (Signed)
PT calling for a RX for a new CPAP. Adv we can not do so because last seen in 2022. He's not happy. NFN

## 2023-02-04 NOTE — Telephone Encounter (Signed)
Pt had already been contacted and notified he would need an appt. Patient says he has moved and will find pulmonologist in [place].

## 2023-02-05 NOTE — Telephone Encounter (Signed)
Spoke with patient the day before He informed that he was going to find a pulmonologist in [place].

## 2023-02-24 ENCOUNTER — Other Ambulatory Visit: Payer: Self-pay | Admitting: Family Medicine

## 2023-03-09 ENCOUNTER — Encounter: Payer: Self-pay | Admitting: Family Medicine

## 2023-03-09 DIAGNOSIS — K219 Gastro-esophageal reflux disease without esophagitis: Secondary | ICD-10-CM

## 2023-05-07 ENCOUNTER — Encounter: Payer: Self-pay | Admitting: Family Medicine

## 2023-05-18 ENCOUNTER — Encounter: Payer: Self-pay | Admitting: Family Medicine

## 2023-05-19 LAB — BASIC METABOLIC PANEL
Creatinine: 1.3 (ref 0.6–1.3)
Glucose: 100

## 2023-05-19 LAB — LIPID PANEL
Cholesterol: 191 (ref 0–200)
HDL: 36 (ref 35–70)
LDL Cholesterol: 117
Triglycerides: 213 — AB (ref 40–160)

## 2023-05-19 LAB — CBC AND DIFFERENTIAL
Hemoglobin: 15.2 (ref 13.5–17.5)
Platelets: 199 10*3/uL (ref 150–400)
WBC: 7

## 2023-05-19 LAB — HEPATIC FUNCTION PANEL
ALT: 28 U/L (ref 10–40)
AST: 26 (ref 14–40)

## 2023-05-20 LAB — LAB REPORT - SCANNED: EGFR: 65

## 2023-05-27 ENCOUNTER — Other Ambulatory Visit: Payer: Self-pay | Admitting: Family Medicine

## 2023-05-27 MED ORDER — NONFORMULARY OR COMPOUNDED ITEM: 1 refills | Status: AC

## 2023-05-27 NOTE — Telephone Encounter (Signed)
Please make sure rx printed, pull for me to sign, and then fax to [facility]   [address] [place], [place] [address]   Thanks.

## 2023-07-08 ENCOUNTER — Telehealth: Payer: 59 | Admitting: Physician Assistant

## 2023-07-08 ENCOUNTER — Telehealth: Payer: 59 | Admitting: Family Medicine

## 2023-07-08 DIAGNOSIS — U071 COVID-19: Secondary | ICD-10-CM

## 2023-07-08 MED ORDER — NIRMATRELVIR/RITONAVIR (PAXLOVID)TABLET: 3.0000 | ORAL_TABLET | Freq: Two times a day (BID) | ORAL | 0 refills | Status: AC

## 2023-07-08 MED ORDER — BENZONATATE 100 MG PO CAPS
100.0000 mg | ORAL_CAPSULE | Freq: Three times a day (TID) | ORAL | 0 refills | Status: DC | PRN
Start: 2023-07-08 — End: 2023-08-13

## 2023-07-08 NOTE — Progress Notes (Signed)
 [place]  ? ?Did VV  ?

## 2023-07-08 NOTE — Patient Instructions (Signed)
 Thomas Sloan, thank you for joining [name], PA-C for today's virtual visit.  While this provider is not your primary care provider (PCP), if your PCP is located in our provider database this encounter information will be shared with them immediately following your visit.   A [facility] MyChart account gives you access to today's visit and all your visits, tests, and labs performed at [facility] " click here if you don't have a [facility] MyChart account or go to [email or web address]  Consent: (Patient) Thomas Sloan provided verbal consent for this virtual visit at the beginning of the encounter.  Current Medications:  Current Outpatient Medications:    benzonatate (TESSALON) 100 MG capsule, Take 1 capsule (100 mg total) by mouth 3 (three) times daily as needed for cough., Disp: 30 capsule, Rfl: 0   nirmatrelvir/ritonavir (PAXLOVID) 20 x 150 MG & 10 x 100MG  TABS, Take 3 tablets by mouth 2 (two) times daily for 5 days. (Take nirmatrelvir 150 mg two tablets twice daily for 5 days and ritonavir 100 mg one tablet twice daily for 5 days) Patient GFR is >60, Disp: 30 tablet, Rfl: 0   clomiPHENE (CLOMID) 50 MG tablet, Take 0.5 tablets (25 mg total) by mouth daily., Disp: , Rfl:    fluticasone (FLONASE) 50 MCG/ACT nasal spray, Place 2 sprays into both nostrils daily., Disp: 16 g, Rfl: 6   NONFORMULARY OR COMPOUNDED ITEM, Tirzepatide 10 mg per mL mixed with pyridoxine 10 mg per mL.  Dispense 2 of the 3ml vials.  Inject 0.75 ml (7.5mg  Tirzepatide) into the skin weekly., Disp: 6 each, Rfl: 1   omeprazole (PRILOSEC) 20 MG capsule, TAKE 1 TO 2 CAPSULES(20 TO 40 MG) BY MOUTH DAILY, Disp: 180 capsule, Rfl: 1   tirzepatide (MOUNJARO) 5 MG/0.5ML Pen, Inject 5 mg into the skin once a week., Disp: 6 mL, Rfl: 1   triamcinolone cream (KENALOG) 0.1 %, Apply topically as needed., Disp: 30 g, Rfl: 1   Medications ordered in this encounter:  Meds ordered this encounter  Medications    nirmatrelvir/ritonavir (PAXLOVID) 20 x 150 MG & 10 x 100MG  TABS    Sig: Take 3 tablets by mouth 2 (two) times daily for 5 days. (Take nirmatrelvir 150 mg two tablets twice daily for 5 days and ritonavir 100 mg one tablet twice daily for 5 days) Patient GFR is >60    Dispense:  30 tablet    Refill:  0    Supervising Provider:   Merrilee Jansky [[id number]]   benzonatate (TESSALON) 100 MG capsule    Sig: Take 1 capsule (100 mg total) by mouth 3 (three) times daily as needed for cough.    Dispense:  30 capsule    Refill:  0    Supervising Provider:   Merrilee Jansky [[id number]]     *If you need refills on other medications prior to your next appointment, please contact your pharmacy*  Follow-Up: Call back or seek an in-person evaluation if the symptoms worsen or if the condition fails to improve as anticipated.  [facility] [phone]  Care Instructions: Please keep well-hydrated and get plenty of rest. Start a saline nasal rinse to flush out your nasal passages. You can use plain Mucinex to help thin congestion. If you have a humidifier, running in the bedroom at night. I want you to start OTC vitamin D3 1000 units daily, vitamin C 1000 mg daily, and a zinc supplement. Please take prescribed medications as directed.  Isolation Instructions: You are to isolate at home until you have been fever free for at least 24 hours without a fever-reducing medication, and symptoms have been steadily improving for 24 hours. At that time,  you can end isolation but need to mask for an additional 5 days.   If you must be around other household members who do not have symptoms, you need to make sure that both you and the family members are masking consistently with a high-quality mask.  If you note any worsening of symptoms despite treatment, please seek an in-person evaluation ASAP. If you note any significant shortness of breath or any chest pain, please seek ER evaluation.  Please do not delay care!   COVID-19: What to Do if You Are Sick If you test positive and are an older adult or someone who is at high risk of getting very sick from COVID-19, treatment may be available. Contact a healthcare provider right away after a positive test to determine if you are eligible, even if your symptoms are mild right now. You can also visit a Test to Treat location and, if eligible, receive a prescription from a provider. Don't delay: Treatment must be started within the first few days to be effective. If you have a fever, cough, or other symptoms, you might have COVID-19. Most people have mild illness and are able to recover at home. If you are sick: Keep track of your symptoms. If you have an emergency warning sign (including trouble breathing), call 911. Steps to help prevent the spread of COVID-19 if you are sick If you are sick with COVID-19 or think you might have COVID-19, follow the steps below to care for yourself and to help protect other people in your home and community. Stay home except to get medical care Stay home. Most people with COVID-19 have mild illness and can recover at home without medical care. Do not leave your home, except to get medical care. Do not visit public areas and do not go to places where you are unable to wear a mask. Take care of yourself. Get rest and stay hydrated. Take over-the-counter medicines, such as acetaminophen, to help you feel better. Stay in touch with your doctor. Call before you get medical care. Be sure to get care if you have trouble breathing, or have any other emergency warning signs, or if you think it is an emergency. Avoid public transportation, ride-sharing, or taxis if possible. Get tested If you have symptoms of COVID-19, get tested. While waiting for test results, stay away from others, including staying apart from those living in your household. Get tested as soon as possible after your symptoms start. Treatments may  be available for people with COVID-19 who are at risk for becoming very sick. Don't delay: Treatment must be started early to be effective--some treatments must begin within 5 days of your first symptoms. Contact your healthcare provider right away if your test result is positive to determine if you are eligible. Self-tests are one of several options for testing for the virus that causes COVID-19 and may be more convenient than laboratory-based tests and point-of-care tests. Ask your healthcare provider or your local health department if you need help interpreting your test results. You can visit your state, tribal, local, and territorial health department's website to look for the latest local information on testing sites. Separate yourself from other people As much as possible, stay in a specific room and away from other people and pets in your  home. If possible, you should use a separate bathroom. If you need to be around other people or animals in or outside of the home, wear a well-fitting mask. Tell your close contacts that they may have been exposed to COVID-19. An infected person can spread COVID-19 starting 48 hours (or 2 days) before the person has any symptoms or tests positive. By letting your close contacts know they may have been exposed to COVID-19, you are helping to protect everyone. See COVID-19 and Animals if you have questions about pets. If you are diagnosed with COVID-19, someone from the health department may call you. Answer the call to slow the spread. Monitor your symptoms Symptoms of COVID-19 include fever, cough, or other symptoms. Follow care instructions from your healthcare provider and local health department. Your local health authorities may give instructions on checking your symptoms and reporting information. When to seek emergency medical attention Look for emergency warning signs* for COVID-19. If someone is showing any of these signs, seek emergency medical care  immediately: Trouble breathing Persistent pain or pressure in the chest New confusion Inability to wake or stay awake Pale, gray, or blue-colored skin, lips, or nail beds, depending on skin tone *This list is not all possible symptoms. Please call your medical provider for any other symptoms that are severe or concerning to you. Call 911 or call ahead to your local emergency facility: Notify the operator that you are seeking care for someone who has or may have COVID-19. Call ahead before visiting your doctor Call ahead. Many medical visits for routine care are being postponed or done by phone or telemedicine. If you have a medical appointment that cannot be postponed, call your doctor's office, and tell them you have or may have COVID-19. This will help the office protect themselves and other patients. If you are sick, wear a well-fitting mask You should wear a mask if you must be around other people or animals, including pets (even at home). Wear a mask with the best fit, protection, and comfort for you. You don't need to wear the mask if you are alone. If you can't put on a mask (because of trouble breathing, for example), cover your coughs and sneezes in some other way. Try to stay at least 6 feet away from other people. This will help protect the people around you. Masks should not be placed on young children under age 46 years, anyone who has trouble breathing, or anyone who is not able to remove the mask without help. Cover your coughs and sneezes Cover your mouth and nose with a tissue when you cough or sneeze. Throw away used tissues in a lined trash can. Immediately wash your hands with soap and water for at least 20 seconds. If soap and water are not available, clean your hands with an alcohol-based hand sanitizer that contains at least 60% alcohol. Clean your hands often Wash your hands often with soap and water for at least 20 seconds. This is especially important after blowing your  nose, coughing, or sneezing; going to the bathroom; and before eating or preparing food. Use hand sanitizer if soap and water are not available. Use an alcohol-based hand sanitizer with at least 60% alcohol, covering all surfaces of your hands and rubbing them together until they feel dry. Soap and water are the best option, especially if hands are visibly dirty. Avoid touching your eyes, nose, and mouth with unwashed hands. Handwashing Tips Avoid sharing personal household items Do not share dishes, drinking  glasses, cups, eating utensils, towels, or bedding with other people in your home. Wash these items thoroughly after using them with soap and water or put in the dishwasher. Clean surfaces in your home regularly Clean and disinfect high-touch surfaces (for example, doorknobs, tables, handles, light switches, and countertops) in your "sick room" and bathroom. In shared spaces, you should clean and disinfect surfaces and items after each use by the person who is ill. If you are sick and cannot clean, a caregiver or other person should only clean and disinfect the area around you (such as your bedroom and bathroom) on an as needed basis. Your caregiver/other person should wait as long as possible (at least several hours) and wear a mask before entering, cleaning, and disinfecting shared spaces that you use. Clean and disinfect areas that may have blood, stool, or body fluids on them. Use household cleaners and disinfectants. Clean visible dirty surfaces with household cleaners containing soap or detergent. Then, use a household disinfectant. Use a product from [employer] List N: Disinfectants for Coronavirus (COVID-19). Be sure to follow the instructions on the label to ensure safe and effective use of the product. Many products recommend keeping the surface wet with a disinfectant for a certain period of time (look at "contact time" on the product label). You may also need to wear personal protective  equipment, such as gloves, depending on the directions on the product label. Immediately after disinfecting, wash your hands with soap and water for 20 seconds. For completed guidance on cleaning and disinfecting your home, visit Complete Disinfection Guidance. Take steps to improve ventilation at home Improve ventilation (air flow) at home to help prevent from spreading COVID-19 to other people in your household. Clear out COVID-19 virus particles in the air by opening windows, using air filters, and turning on fans in your home. Use this interactive tool to learn how to improve air flow in your home. When you can be around others after being sick with COVID-19 Deciding when you can be around others is different for different situations. Find out when you can safely end home isolation. For any additional questions about your care, contact your healthcare provider or state or local health department. 07/30/2020 Content source: [facility] Immun[facility] and Res[facility] Diseases ([facility]), Division of Viral Diseases This information is not intended to replace advice given to you by your health care provider. Make sure you discuss any questions you have with your health care provider. Document Revised: 09/12/2020 Document Reviewed: 09/12/2020 Elsevier Patient Education  2022 [employer].     If you have been instructed to have an in-person evaluation today at a local Urgent Care facility, please use the link below. It will take you to a list of all of our available [facility], including address, phone number and hours of operation. Please do not delay care.  [facility]  If you or a family member do not have a primary care provider, use the link below to schedule a visit and establish care. When you choose a [facility] primary care physician or advanced practice provider, you gain a long-term partner in health. Find a Primary Care Provider  Learn more about  [facility] in-office and virtual care options: [facility] - Get Care Now

## 2023-07-08 NOTE — Progress Notes (Signed)
 Virtual Visit Consent   Thomas Sloan, you are scheduled for a virtual visit with a [facility] provider today. Just as with appointments in the office, your consent must be obtained to participate. Your consent will be active for this visit and any virtual visit you may have with one of our providers in the next 365 days. If you have a MyChart account, a copy of this consent can be sent to you electronically.  As this is a virtual visit, video technology does not allow for your provider to perform a traditional examination. This may limit your provider's ability to fully assess your condition. If your provider identifies any concerns that need to be evaluated in person or the need to arrange testing (such as labs, EKG, etc.), we will make arrangements to do so. Although advances in technology are sophisticated, we cannot ensure that it will always work on either your end or our end. If the connection with a video visit is poor, the visit may have to be switched to a telephone visit. With either a video or telephone visit, we are not always able to ensure that we have a secure connection.  By engaging in this virtual visit, you consent to the provision of healthcare and authorize for your insurance to be billed (if applicable) for the services provided during this visit. Depending on your insurance coverage, you may receive a charge related to this service.  I need to obtain your verbal consent now. Are you willing to proceed with your visit today? [name] has provided verbal consent on 07/08/2023 for a virtual visit (video or telephone). [name], [place]  Date: 07/08/2023 12:41 PM   Virtual Visit via Video Note   I, [name], connected with  Kahleb Mcclane  ([id number], [id number]) on 07/08/23 at 12:45 PM EST by a video-enabled telemedicine application and verified that I am speaking with the correct person using two identifiers.  Location: Patient: Virtual Visit Location  Patient: Home Provider: Virtual Visit Location Provider: Home Office   I discussed the limitations of evaluation and management by telemedicine and the availability of in person appointments. The patient expressed understanding and agreed to proceed.    History of Present Illness: [name] is a 58 y.o. who identifies as a male who was assigned male at birth, and is being seen today for COVID-19. Patient endorses symptoms onset last night including sore throat (last night) followed by development of sweats and chills, fatigue and cough (mainly dry). Unsure of fever. Tested positive with home test that was positive for COVID. Denies chest tightness and shortness of breath.  Oxygen at 98% RA.   OTC -- Sudafed  HPI: HPI  Problems:  Patient Active Problem List   Diagnosis Date Noted   Joint pain 10/05/2022   Obesity with body mass index (BMI) of 30.0 to 39.9 06/05/2021   Shoulder pain 06/05/2021   Visit for suture removal 04/02/2021   OSA (obstructive sleep apnea) 03/18/2020   FH: prostate cancer 04/21/2017   Health care maintenance 04/21/2017   Advance care planning 03/13/2016   Overweight 06/20/2013   Hair loss 02/24/2013   Routine general medical examination at a health care facility 10/27/2010   Onychomycosis 10/27/2010   Major depression 03/24/2010   Seasonal and perennial allergic rhinitis 03/24/2010   GERD 03/24/2010   SKIN CANCER, HX OF 03/24/2010    Allergies:  Allergies  Allergen Reactions   Gramineae Pollens     Other reaction(s): Other Runny nose and  itchy eyes   Medications:  Current Outpatient Medications:    benzonatate (TESSALON) 100 MG capsule, Take 1 capsule (100 mg total) by mouth 3 (three) times daily as needed for cough., Disp: 30 capsule, Rfl: 0   nirmatrelvir/ritonavir (PAXLOVID) 20 x 150 MG & 10 x 100MG  TABS, Take 3 tablets by mouth 2 (two) times daily for 5 days. (Take nirmatrelvir 150 mg two tablets twice daily for 5 days and ritonavir 100 mg one  tablet twice daily for 5 days) Patient GFR is >60, Disp: 30 tablet, Rfl: 0   clomiPHENE (CLOMID) 50 MG tablet, Take 0.5 tablets (25 mg total) by mouth daily., Disp: , Rfl:    fluticasone (FLONASE) 50 MCG/ACT nasal spray, Place 2 sprays into both nostrils daily., Disp: 16 g, Rfl: 6   NONFORMULARY OR COMPOUNDED ITEM, Tirzepatide 10 mg per mL mixed with pyridoxine 10 mg per mL.  Dispense 2 of the 3ml vials.  Inject 0.75 ml (7.5mg  Tirzepatide) into the skin weekly., Disp: 6 each, Rfl: 1   omeprazole (PRILOSEC) 20 MG capsule, TAKE 1 TO 2 CAPSULES(20 TO 40 MG) BY MOUTH DAILY, Disp: 180 capsule, Rfl: 1   tirzepatide (MOUNJARO) 5 MG/0.5ML Pen, Inject 5 mg into the skin once a week., Disp: 6 mL, Rfl: 1   triamcinolone cream (KENALOG) 0.1 %, Apply topically as needed., Disp: 30 g, Rfl: 1  Observations/Objective: Patient is well-developed, well-nourished in no acute distress.  Resting comfortably  at home.  Head is normocephalic, atraumatic.  No labored breathing.  Speech is clear and coherent with logical content.  Patient is alert and oriented at baseline.   Assessment and Plan: 1. COVID-19 (Primary) - nirmatrelvir/ritonavir (PAXLOVID) 20 x 150 MG & 10 x 100MG  TABS; Take 3 tablets by mouth 2 (two) times daily for 5 days. (Take nirmatrelvir 150 mg two tablets twice daily for 5 days and ritonavir 100 mg one tablet twice daily for 5 days) Patient GFR is >60  Dispense: 30 tablet; Refill: 0 - benzonatate (TESSALON) 100 MG capsule; Take 1 capsule (100 mg total) by mouth 3 (three) times daily as needed for cough.  Dispense: 30 capsule; Refill: 0  Patient with multiple risk factors for complicated course of illness. Discussed risks/benefits of antiviral medications including most common potential ADRs. Patient voiced understanding and would like to proceed with antiviral medication. They are candidate for Paxlovid. Rx sent to pharmacy. Supportive measures, OTC medications and vitamin regimen reviewed. Tessalon  per orders. Quarantine reviewed in detail. Strict ER precautions discussed with patient.    Follow Up Instructions: I discussed the assessment and treatment plan with the patient. The patient was provided an opportunity to ask questions and all were answered. The patient agreed with the plan and demonstrated an understanding of the instructions.  A copy of instructions were sent to the patient via MyChart unless otherwise noted below.   The patient was advised to call back or seek an in-person evaluation if the symptoms worsen or if the condition fails to improve as anticipated.    [name], PA-C

## 2023-07-26 ENCOUNTER — Encounter: Payer: Self-pay | Admitting: Family Medicine

## 2023-07-27 ENCOUNTER — Encounter: Payer: Self-pay | Admitting: Family Medicine

## 2023-07-28 NOTE — Telephone Encounter (Signed)
 Patient is scheduled to be seen 08/13/22. Printed papers and placed in your tray for you to review.

## 2023-08-02 ENCOUNTER — Encounter: Payer: Self-pay | Admitting: Family Medicine

## 2023-08-13 ENCOUNTER — Encounter: Payer: Self-pay | Admitting: Family Medicine

## 2023-08-13 ENCOUNTER — Ambulatory Visit (INDEPENDENT_AMBULATORY_CARE_PROVIDER_SITE_OTHER): Admitting: Family Medicine

## 2023-08-13 VITALS — BP 116/64 | HR 83 | Temp 98.4°F | Ht 70.16 in | Wt 254.0 lb

## 2023-08-13 DIAGNOSIS — Z0000 Encounter for general adult medical examination without abnormal findings: Secondary | ICD-10-CM

## 2023-08-13 DIAGNOSIS — E663 Overweight: Secondary | ICD-10-CM

## 2023-08-13 DIAGNOSIS — Z7189 Other specified counseling: Secondary | ICD-10-CM

## 2023-08-13 DIAGNOSIS — J302 Other seasonal allergic rhinitis: Secondary | ICD-10-CM

## 2023-08-13 DIAGNOSIS — G4733 Obstructive sleep apnea (adult) (pediatric): Secondary | ICD-10-CM

## 2023-08-13 MED ORDER — CLOMIPHENE CITRATE 50 MG PO TABS: 25.0000 mg | ORAL_TABLET | ORAL | Status: AC

## 2023-08-13 MED ORDER — OMEPRAZOLE 20 MG PO CPDR
20.0000 mg | DELAYED_RELEASE_CAPSULE | Freq: Every day | ORAL | 3 refills | Status: AC
Start: 2023-08-13 — End: ?

## 2023-08-13 NOTE — Patient Instructions (Addendum)
 Let me know about your preferences re: getting Tirzepatide filled later this summer.   If you have more skin irritation then let me know.  Take care.  Glad to see you.

## 2023-08-13 NOTE — Progress Notes (Signed)
 CPE- See plan.  Routine anticipatory guidance given to patient.  See health maintenance.  The possibility exists that previously documented standard health maintenance information may have been brought forward from a previous encounter into this note.  If needed, that same information has been updated to reflect the current situation based on today's encounter.    Tetanus 2022 Flu yearly PNA d/w pt.   shingles previously done covid vaccine previously done Colonoscopy 2020- f/u pending with GI 2025.  PSA per urology.  Living will d/w pt. If incapacitated, would have wife designated.  Diet and exercise d/w pt. Recent labs discussed with patient.  Still seeing dermatology yearly.    He had increased seasonal allergies, some better now.  Still using flonase and claritin.    D/w pt about tirzepatide use.  He had some local itching at the injection site.  Taking 7.5mg  per dose.  After increase in dose to 7.5mg , he had more GERD.  He split the dose, ie 3.5mg  on one day and 4mg  later in the week.  That helped overall with GERD and local reaction.  He is working on his diet more.  Elevating his bed, etc.    He is still trying to use CPAP nightly, d/w pt.    PMH and SH reviewed  Meds, vitals, and allergies reviewed.   ROS: Per HPI.  Unless specifically indicated otherwise in HPI, the patient denies:  General: fever. Eyes: acute vision changes ENT: sore throat Cardiovascular: chest pain Respiratory: SOB GI: vomiting GU: dysuria Musculoskeletal: acute back pain Derm: acute rash Neuro: acute motor dysfunction Psych: worsening mood Endocrine: polydipsia Heme: bleeding Allergy: hayfever  GEN: nad, alert and oriented HEENT: ncat NECK: supple w/o LA CV: rrr. PULM: ctab, no inc wob ABD: soft, +bs EXT: no edema SKIN: no acute rash

## 2023-08-15 NOTE — Assessment & Plan Note (Signed)
 Would continue flonase and claritin.  Could temporarily increase his dose of flonase if needed.

## 2023-08-15 NOTE — Assessment & Plan Note (Signed)
 Tetanus 2022 Flu yearly PNA d/w pt.   shingles previously done covid vaccine previously done Colonoscopy 2020- f/u pending with GI 2025.  PSA per urology.  Living will d/w pt. If incapacitated, would have wife designated.  Diet and exercise d/w pt. Recent labs discussed with patient.

## 2023-08-15 NOTE — Assessment & Plan Note (Signed)
 He split the dose, ie 3.5mg  on one day and 4mg  later in the week.  That helped overall with GERD and local reaction.  He is working on his diet more.  Elevating his bed, etc.   I would continue as is and we can recheck his labs later in the year if needed.  He can update me as needed.

## 2023-08-15 NOTE — Assessment & Plan Note (Signed)
Living will d/w pt. If incapacitated, would have wife designated.  

## 2023-08-15 NOTE — Assessment & Plan Note (Signed)
 Discussed trying to use CPAP nightly.

## 2024-03-19 ENCOUNTER — Telehealth: Admitting: Physician Assistant

## 2024-03-19 DIAGNOSIS — M5431 Sciatica, right side: Secondary | ICD-10-CM

## 2024-03-19 MED ORDER — CYCLOBENZAPRINE HCL 10 MG PO TABS: 10.0000 mg | ORAL_TABLET | Freq: Three times a day (TID) | ORAL | 0 refills | Status: AC | PRN

## 2024-03-19 MED ORDER — PREDNISONE 20 MG PO TABS: 40.0000 mg | ORAL_TABLET | Freq: Every day | ORAL | 0 refills | Status: AC

## 2024-03-19 NOTE — Progress Notes (Signed)
 Virtual Visit Consent   [name], you are scheduled for a virtual visit with a [facility] provider today. Just as with appointments in the office, your consent must be obtained to participate. Your consent will be active for this visit and any virtual visit you may have with one of our providers in the next 365 days. If you have a MyChart account, a copy of this consent can be sent to you electronically.  As this is a virtual visit, video technology does not allow for your provider to perform a traditional examination. This may limit your provider's ability to fully assess your condition. If your provider identifies any concerns that need to be evaluated in person or the need to arrange testing (such as labs, EKG, etc.), we will make arrangements to do so. Although advances in technology are sophisticated, we cannot ensure that it will always work on either your end or our end. If the connection with a video visit is poor, the visit may have to be switched to a telephone visit. With either a video or telephone visit, we are not always able to ensure that we have a secure connection.  By engaging in this virtual visit, you consent to the provision of healthcare and authorize for your insurance to be billed (if applicable) for the services provided during this visit. Depending on your insurance coverage, you may receive a charge related to this service.  I need to obtain your verbal consent now. Are you willing to proceed with your visit today? Josuha Fontanez has provided verbal consent on 03/19/2024 for a virtual visit (video or telephone). Harlene PEDLAR Ward, PA-C  Date: 03/19/2024 10:46 AM   Virtual Visit via Video Note   I, Harlene PEDLAR Ward, connected with  Thomas Sloan  ([id number], [birth year]) on 03/19/24 at 10:30 AM EST by a video-enabled telemedicine application and verified that I am speaking with the correct person using two identifiers.  Location: Patient: Virtual Visit Location Patient:  Home Provider: Virtual Visit Location Provider: Home Office   I discussed the limitations of evaluation and management by telemedicine and the availability of in person appointments. The patient expressed understanding and agreed to proceed.    History of Present Illness: Thomas Sloan is a 58 y.o. who identifies as a male who was assigned male at birth, and is being seen today for gradual onset of right leg numbness, pain, cramping to the buttocks and hip.  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  The long he sits the worse it gets.  He has been doing some stretching and using a theragun with some relief.  Reports a spasm sensation, numbness, tingling. Denies current back pain.  He has tried advil with temporary relief.   HPI: HPI  Problems:  Patient Active Problem List   Diagnosis Date Noted   Joint pain 10/05/2022   Obesity with body mass index (BMI) of 30.0 to 39.9 06/05/2021   Shoulder pain 06/05/2021   Visit for suture removal 04/02/2021   OSA (obstructive sleep apnea) 03/18/2020   FH: prostate cancer 04/21/2017   Health care maintenance 04/21/2017   Advance care planning 03/13/2016   Overweight 06/20/2013   Hair loss 02/24/2013   Routine general medical examination at a health care facility 10/27/2010   Onychomycosis 10/27/2010   Major depression 03/24/2010   Seasonal and perennial allergic rhinitis 03/24/2010   GERD 03/24/2010   SKIN CANCER, HX OF 03/24/2010    Allergies:  Allergies  Allergen Reactions  Gramineae Pollens     Other reaction(s): Other Runny nose and itchy eyes   Medications:  Current Outpatient Medications:    cyclobenzaprine  (FLEXERIL ) 10 MG tablet, Take 1 tablet (10 mg total) by mouth 3 (three) times daily as needed for muscle spasms., Disp: 30 tablet, Rfl: 0   predniSONE  (DELTASONE ) 20 MG tablet, Take 2 tablets (40 mg total) by mouth daily with breakfast for 5 days., Disp: 10 tablet, Rfl: 0   clomiPHENE  (CLOMID ) 50 MG tablet, Take 0.5 tablets (25  mg total) by mouth every other day., Disp: , Rfl:    fluticasone  (FLONASE ) 50 MCG/ACT nasal spray, Place 2 sprays into both nostrils daily., Disp: 16 g, Rfl: 6   loratadine (CLARITIN) 10 MG tablet, Take 10 mg by mouth daily., Disp: , Rfl:    NONFORMULARY OR COMPOUNDED ITEM, Tirzepatide  10 mg per mL mixed with pyridoxine 10 mg per mL.  Dispense 2 of the 3ml vials.  Inject 0.75 ml (7.5mg  Tirzepatide ) into the skin weekly., Disp: 6 each, Rfl: 1   omeprazole  (PRILOSEC ) 20 MG capsule, Take 1-2 capsules (20-40 mg total) by mouth daily., Disp: 180 capsule, Rfl: 3   triamcinolone  cream (KENALOG ) 0.1 %, Apply topically as needed., Disp: 30 g, Rfl: 1  Observations/Objective: Patient is well-developed, well-nourished in no acute distress.  Resting comfortably at home.  Head is normocephalic, atraumatic.  No labored breathing.  Speech is clear and coherent with logical content.  Patient is alert and oriented at baseline.    Assessment and Plan: 1. Sciatica of right side (Primary)  Pt with sciatica vs piriformis syndrome.  Flexeril  and prednisone  prescribed.  Supportive care discussed.  Pt will be seeing PT.   Follow Up Instructions: I discussed the assessment and treatment plan with the patient. The patient was provided an opportunity to ask questions and all were answered. The patient agreed with the plan and demonstrated an understanding of the instructions.  A copy of instructions were sent to the patient via MyChart unless otherwise noted below.     The patient was advised to call back or seek an in-person evaluation if the symptoms worsen or if the condition fails to improve as anticipated.    Harlene PEDLAR Ward, PA-C

## 2024-03-19 NOTE — Patient Instructions (Signed)
 Norleen Massing, thank you for joining Harlene PEDLAR Ward, PA-C for today's virtual visit.  While this provider is not your primary care provider (PCP), if your PCP is located in our provider database this encounter information will be shared with them immediately following your visit.   A [facility] MyChart account gives you access to today's visit and all your visits, tests, and labs performed at [facility]  click here if you don't have a [facility] MyChart account or go to [email or web address]  Consent: (Patient) Thomas Sloan provided verbal consent for this virtual visit at the beginning of the encounter.  Current Medications:  Current Outpatient Medications:    cyclobenzaprine  (FLEXERIL ) 10 MG tablet, Take 1 tablet (10 mg total) by mouth 3 (three) times daily as needed for muscle spasms., Disp: 30 tablet, Rfl: 0   predniSONE  (DELTASONE ) 20 MG tablet, Take 2 tablets (40 mg total) by mouth daily with breakfast for 5 days., Disp: 10 tablet, Rfl: 0   clomiPHENE  (CLOMID ) 50 MG tablet, Take 0.5 tablets (25 mg total) by mouth every other day., Disp: , Rfl:    fluticasone  (FLONASE ) 50 MCG/ACT nasal spray, Place 2 sprays into both nostrils daily., Disp: 16 g, Rfl: 6   loratadine (CLARITIN) 10 MG tablet, Take 10 mg by mouth daily., Disp: , Rfl:    NONFORMULARY OR COMPOUNDED ITEM, Tirzepatide  10 mg per mL mixed with pyridoxine 10 mg per mL.  Dispense 2 of the 3ml vials.  Inject 0.75 ml (7.5mg  Tirzepatide ) into the skin weekly., Disp: 6 each, Rfl: 1   omeprazole  (PRILOSEC ) 20 MG capsule, Take 1-2 capsules (20-40 mg total) by mouth daily., Disp: 180 capsule, Rfl: 3   triamcinolone  cream (KENALOG ) 0.1 %, Apply topically as needed., Disp: 30 g, Rfl: 1   Medications ordered in this encounter:  Meds ordered this encounter  Medications   predniSONE  (DELTASONE ) 20 MG tablet    Sig: Take 2 tablets (40 mg total) by mouth daily with breakfast for 5 days.    Dispense:  10 tablet    Refill:  0     Supervising Provider:   LAMPTEY, PHILIP O [[id number]]   cyclobenzaprine  (FLEXERIL ) 10 MG tablet    Sig: Take 1 tablet (10 mg total) by mouth 3 (three) times daily as needed for muscle spasms.    Dispense:  30 tablet    Refill:  0    Supervising Provider:   LAMPTEY, PHILIP O [[id number]]     *If you need refills on other medications prior to your next appointment, please contact your pharmacy*  Follow-Up: Call back or seek an in-person evaluation if the symptoms worsen or if the condition fails to improve as anticipated.  [facility] [phone]  Other Instructions Take prednisone  as prescribed.  Can take flexeril  as needed for muscle spasm.  Recommend follow up with PT.    If you have been instructed to have an in-person evaluation today at a local Urgent Care facility, please use the link below. It will take you to a list of all of our available [facility], including address, phone number and hours of operation. Please do not delay care.  [facility]  If you or a family member do not have a primary care provider, use the link below to schedule a visit and establish care. When you choose a [facility] primary care physician or advanced practice provider, you gain a long-term partner in health. Find a Primary Care Provider  Learn more about [facility] in-office and virtual care options: [facility] - Get Care Now
